# Patient Record
Sex: Female | Born: 1985 | Race: Black or African American | Hispanic: No | Marital: Married | Smoking: Former smoker
Health system: Southern US, Community
[De-identification: ages and names within clinical notes are randomized; demographics above are authoritative.]

## PROBLEM LIST (undated history)

## (undated) DIAGNOSIS — K219 Gastro-esophageal reflux disease without esophagitis: Secondary | ICD-10-CM

## (undated) DIAGNOSIS — I10 Essential (primary) hypertension: Secondary | ICD-10-CM

## (undated) DIAGNOSIS — N390 Urinary tract infection, site not specified: Secondary | ICD-10-CM

## (undated) HISTORY — PX: FRACTURE SURGERY: SHX138

---

## 2012-01-07 ENCOUNTER — Telehealth: Payer: Self-pay | Admitting: Emergency Medicine

## 2014-07-06 ENCOUNTER — Emergency Department (HOSPITAL_COMMUNITY)
Admission: EM | Admit: 2014-07-06 | Discharge: 2014-07-06 | Disposition: A | Discharge status: Home / Self Care | Payer: Self-pay | Attending: Emergency Medicine | Admitting: Emergency Medicine

## 2014-07-06 ENCOUNTER — Encounter (HOSPITAL_COMMUNITY): Payer: Self-pay

## 2014-07-06 ENCOUNTER — Emergency Department (HOSPITAL_COMMUNITY): Payer: Self-pay

## 2014-07-06 DIAGNOSIS — Z720 Tobacco use: Secondary | ICD-10-CM | POA: Insufficient documentation

## 2014-07-06 DIAGNOSIS — Y9339 Activity, other involving climbing, rappelling and jumping off: Secondary | ICD-10-CM | POA: Insufficient documentation

## 2014-07-06 DIAGNOSIS — T148XXA Other injury of unspecified body region, initial encounter: Secondary | ICD-10-CM

## 2014-07-06 DIAGNOSIS — Y92410 Unspecified street and highway as the place of occurrence of the external cause: Secondary | ICD-10-CM | POA: Insufficient documentation

## 2014-07-06 DIAGNOSIS — I10 Essential (primary) hypertension: Secondary | ICD-10-CM | POA: Insufficient documentation

## 2014-07-06 DIAGNOSIS — S82851A Displaced trimalleolar fracture of right lower leg, initial encounter for closed fracture: Secondary | ICD-10-CM | POA: Insufficient documentation

## 2014-07-06 DIAGNOSIS — Y998 Other external cause status: Secondary | ICD-10-CM | POA: Insufficient documentation

## 2014-07-06 DIAGNOSIS — W19XXXA Unspecified fall, initial encounter: Secondary | ICD-10-CM

## 2014-07-06 HISTORY — DX: Essential (primary) hypertension: I10

## 2014-07-06 LAB — CBC WITH DIFFERENTIAL/PLATELET
BASOS ABS: 0 10*3/uL (ref 0.0–0.1)
Basophils Relative: 0 % (ref 0–1)
EOS PCT: 1 % (ref 0–5)
Eosinophils Absolute: 0 10*3/uL (ref 0.0–0.7)
HCT: 40.6 % (ref 36.0–46.0)
Hemoglobin: 13.8 g/dL (ref 12.0–15.0)
LYMPHS PCT: 25 % (ref 12–46)
Lymphs Abs: 1.6 10*3/uL (ref 0.7–4.0)
MCH: 28.6 pg (ref 26.0–34.0)
MCHC: 34 g/dL (ref 30.0–36.0)
MCV: 84.1 fL (ref 78.0–100.0)
MONO ABS: 0.3 10*3/uL (ref 0.1–1.0)
Monocytes Relative: 4 % (ref 3–12)
Neutro Abs: 4.4 10*3/uL (ref 1.7–7.7)
Neutrophils Relative %: 70 % (ref 43–77)
Platelets: 274 10*3/uL (ref 150–400)
RBC: 4.83 MIL/uL (ref 3.87–5.11)
RDW: 13.5 % (ref 11.5–15.5)
WBC: 6.3 10*3/uL (ref 4.0–10.5)

## 2014-07-06 LAB — BASIC METABOLIC PANEL
ANION GAP: 9 (ref 5–15)
CO2: 25 mmol/L (ref 22–32)
CREATININE: 0.92 mg/dL (ref 0.44–1.00)
Calcium: 9.1 mg/dL (ref 8.9–10.3)
Chloride: 104 mmol/L (ref 101–111)
GFR calc Af Amer: >60 mL/min (ref >60)
Glucose, Bld: 123 mg/dL — ABNORMAL HIGH (ref 65–99)
Potassium: 3.9 mmol/L (ref 3.5–5.1)
Sodium: 138 mmol/L (ref 135–145)

## 2014-07-06 LAB — POC URINE PREG, ED: Preg Test, Ur: NEGATIVE

## 2014-07-06 MED ORDER — OXYCODONE-ACETAMINOPHEN 5-325 MG PO TABS: 1.0000 | ORAL_TABLET | ORAL | Status: DC | PRN

## 2014-07-06 MED ORDER — TETANUS-DIPHTH-ACELL PERTUSSIS 5-2.5-18.5 LF-MCG/0.5 IM SUSP
0.5000 mL | Freq: Once | INTRAMUSCULAR | Status: AC
  Administered 2014-07-06: 0.5 mL via INTRAMUSCULAR
  Filled 2014-07-06: qty 0.5

## 2014-07-06 MED ORDER — DIAZEPAM 5 MG/ML IJ SOLN
5.0000 mg | Freq: Once | INTRAMUSCULAR | Status: AC
Start: 2014-07-06 — End: 2014-07-06
  Administered 2014-07-06: 5 mg via INTRAVENOUS
  Filled 2014-07-06: qty 2

## 2014-07-06 MED ORDER — HYDROMORPHONE HCL 1 MG/ML IJ SOLN
1.0000 mg | Freq: Once | INTRAMUSCULAR | Status: AC
  Administered 2014-07-06: 1 mg via INTRAVENOUS
  Filled 2014-07-06: qty 1

## 2014-07-06 NOTE — ED Notes (Signed)
MD at bedside to reduce ankle.  

## 2014-07-06 NOTE — ED Notes (Signed)
Wasted 5MG  diazepam with Lesli Albee RN 0700 07/06/14.
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
## 2014-07-06 NOTE — Progress Notes (Signed)
Orthopedic Tech Progress Note Patient Details:  Rebecca Rowe 11-05-1985 [id number]  Ortho Devices Type of Ortho Device: Ace wrap, Post (short leg) splint, Stirrup splint, Crutches Ortho Device/Splint Interventions: Application   [name], Mickie Bail 07/06/2014, 2:41 AM
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
## 2014-07-06 NOTE — Discharge Instructions (Signed)
Ankle Fracture °A fracture is a break in a bone. The ankle joint is made up of three bones. These include the lower (distal) sections of your lower leg bones, called the tibia and fibula, along with a bone in your foot, called the talus. Depending on how bad the break is and if more than one ankle joint bone is broken, a cast or splint is used to protect and keep your injured bone from moving while it heals. Sometimes, surgery is required to help the fracture heal properly.  °There are two general types of fractures: °· Stable fracture. This includes a single fracture line through one bone, with no injury to ankle ligaments. A fracture of the talus that does not have any displacement (movement of the bone on either side of the fracture line) is also stable. °· Unstable fracture. This includes more than one fracture line through one or more bones in the ankle joint. It also includes fractures that have displacement of the bone on either side of the fracture line. °CAUSES °· A direct blow to the ankle.   °· Quickly and severely twisting your ankle. °· Trauma, such as a car accident or falling from a significant height. °RISK FACTORS °You may be at a higher risk of ankle fracture if: °· You have certain medical conditions. °· You are involved in high-impact sports. °· You are involved in a high-impact car accident. °SIGNS AND SYMPTOMS  °· Tender and swollen ankle. °· Bruising around the injured ankle. °· Pain on movement of the ankle. °· Difficulty walking or putting weight on the ankle. °· A cold foot below the site of the ankle injury. This can occur if the blood vessels passing through your injured ankle were also damaged. °· Numbness in the foot below the site of the ankle injury. °DIAGNOSIS  °An ankle fracture is usually diagnosed with a physical exam and X-rays. A CT scan may also be required for complex fractures. °TREATMENT  °Stable fractures are treated with a cast or splint and using crutches to avoid putting  weight on your injured ankle. This is followed by an ankle strengthening program. Some patients require a special type of cast, depending on other medical problems they may have. Unstable fractures require surgery to ensure the bones heal properly. Your health care provider will tell you what type of fracture you have and the best treatment for your condition. °HOME CARE INSTRUCTIONS  °· Review correct crutch use with your health care provider and use your crutches as directed. Safe use of crutches is extremely important. Misuse of crutches can cause you to fall or cause injury to nerves in your hands or armpits. °· Do not put weight or pressure on the injured ankle until directed by your health care provider. °· To lessen the swelling, keep the injured leg elevated while sitting or lying down. °· Apply ice to the injured area: °¨ Put ice in a plastic bag. °¨ Place a towel between your cast and the bag. °¨ Leave the ice on for 20 minutes, 2-3 times a day. °· If you have a plaster or fiberglass cast: °¨ Do not try to scratch the skin under the cast with any objects. This can increase your risk of skin infection. °¨ Check the skin around the cast every day. You may put lotion on any red or sore areas. °¨ Keep your cast dry and clean. °· If you have a plaster splint: °¨ Wear the splint as directed. °¨ You may loosen the elastic   around the splint if your toes become numb, tingle, or turn cold or blue.  Do not put pressure on any part of your cast or splint; it may break. Rest your cast only on a pillow the first 24 hours until it is fully hardened.  Your cast or splint can be protected during bathing with a plastic bag sealed to your skin with medical tape. Do not lower the cast or splint into water.  Take medicines as directed by your health care provider. Only take over-the-counter or prescription medicines for pain, discomfort, or fever as directed by your health care provider.  Do not drive a vehicle until  your health care provider specifically tells you it is safe to do so.  If your health care provider has given you a follow-up appointment, it is very important to keep that appointment. Not keeping the appointment could result in a chronic or permanent injury, pain, and disability. If you have any problem keeping the appointment, call the facility for assistance. SEEK MEDICAL CARE IF: You develop increased swelling or discomfort. SEEK IMMEDIATE MEDICAL CARE IF:   Your cast gets damaged or breaks.  You have continued severe pain.  You develop new pain or swelling after the cast was put on.  Your skin or toenails below the injury turn blue or gray.  Your skin or toenails below the injury feel cold, numb, or have loss of sensitivity to touch.  There is a bad smell or pus draining from under the cast. MAKE SURE YOU:   Understand these instructions.  Will watch your condition.  Will get help right away if you are not doing well or get worse. Document Released: 12/31/1999 Document Revised: 01/07/2013 Document Reviewed: 08/01/2012 [facility] Patient Information 2015 [place], [place]. This information is not intended to replace advice given to you by your health care provider. Make sure you discuss any questions you have with your health care provider.  Abrasion An abrasion is a cut or scrape of the skin. Abrasions do not extend through all layers of the skin and most heal within 10 days. It is important to care for your abrasion properly to prevent infection. CAUSES  Most abrasions are caused by falling on, or gliding across, the ground or other surface. When your skin rubs on something, the outer and inner layer of skin rubs off, causing an abrasion. DIAGNOSIS  Your caregiver will be able to diagnose an abrasion during a physical exam.  TREATMENT  Your treatment depends on how large and deep the abrasion is. Generally, your abrasion will be cleaned with water and a mild soap to remove any  dirt or debris. An antibiotic ointment may be put over the abrasion to prevent an infection. A bandage (dressing) may be wrapped around the abrasion to keep it from getting dirty.  You may need a tetanus shot if:  You cannot remember when you had your last tetanus shot.  You have never had a tetanus shot.  The injury broke your skin. If you get a tetanus shot, your arm may swell, get red, and feel warm to the touch. This is common and not a problem. If you need a tetanus shot and you choose not to have one, there is a rare chance of getting tetanus. Sickness from tetanus can be serious.  HOME CARE INSTRUCTIONS   If a dressing was applied, change it at least once a day or as directed by your caregiver. If the bandage sticks, soak it off with warm water.  Wash the area with water and a mild soap to remove all the ointment 2 times a day. Rinse off the soap and pat the area dry with a clean towel.   Reapply any ointment as directed by your caregiver. This will help prevent infection and keep the bandage from sticking. Use gauze over the wound and under the dressing to help keep the bandage from sticking.   Change your dressing right away if it becomes wet or dirty.   Only take over-the-counter or prescription medicines for pain, discomfort, or fever as directed by your caregiver.   Follow up with your caregiver within 24-48 hours for a wound check, or as directed. If you were not given a wound-check appointment, look closely at your abrasion for redness, swelling, or pus. These are signs of infection. SEEK IMMEDIATE MEDICAL CARE IF:   You have increasing pain in the wound.   You have redness, swelling, or tenderness around the wound.   You have pus coming from the wound.   You have a fever or persistent symptoms for more than 2-3 days.  You have a fever and your symptoms suddenly get worse.  You have a bad smell coming from the wound or dressing.  MAKE SURE YOU:    Understand these instructions.  Will watch your condition.  Will get help right away if you are not doing well or get worse. Document Released: 10/12/2004 Document Revised: 12/20/2011 Document Reviewed: 12/06/2010 [facility] Patient Information 2015 [place], [place]. This information is not intended to replace advice given to you by your health care provider. Make sure you discuss any questions you have with your health care provider.

## 2014-07-09 ENCOUNTER — Other Ambulatory Visit (HOSPITAL_COMMUNITY): Payer: Self-pay | Admitting: Orthopaedic Surgery

## 2014-07-09 DIAGNOSIS — M25571 Pain in right ankle and joints of right foot: Secondary | ICD-10-CM

## 2014-07-10 ENCOUNTER — Other Ambulatory Visit (HOSPITAL_COMMUNITY): Payer: Self-pay | Admitting: Orthopaedic Surgery

## 2014-07-14 ENCOUNTER — Inpatient Hospital Stay (HOSPITAL_COMMUNITY)
Admission: RE | Admit: 2014-07-14 | Discharge: 2014-07-14 | Disposition: A | Discharge status: Home / Self Care | Payer: Self-pay | Source: Ambulatory Visit

## 2014-07-14 ENCOUNTER — Ambulatory Visit (HOSPITAL_COMMUNITY): Admission: RE | Admit: 2014-07-14 | Payer: No Typology Code available for payment source | Source: Ambulatory Visit

## 2014-07-14 NOTE — Progress Notes (Signed)
Patient did not come to PAT appointment, no answer to repeated phone calls.  I left  A message on voice mail.  I instructed the patient to arrive at [facility] entrance at 0630  , nothing to eat or drink after midnight.   I instructed the patient to take the following medications in the am with just enough water to get them down: Percocet if able to take on an empty stomach.  I instructed patient to not take any more Naproxen.  I asked patient to not wear any lotions, powders, cologne, jewelry, piercing, make-up or nail polish and to not shave.  I asked the patient to call [phone], in the am if there were any questions or problems.

## 2014-07-14 NOTE — Pre-Procedure Instructions (Signed)
Rebecca Rowe  07/14/2014      [facility] [address], [place] - [address] [place] [id number] Phone: [phone] Fax: [phone]    Your procedure is scheduled on   Wednesday  07/15/14  [facility] Admitting at 630 A.M.  Call this number if you have problems the morning of surgery:  [phone]   Remember:  Do not eat food or drink liquids after midnight.  Take these medicines the morning of surgery with A SIP OF WATER    OXYCODONE  IF NEEDED   (STOP NAPROXEN/ ALEVE/ ANAPROX, NO ASPIRIN, GOODY POWDERS, vitamins, herbal medicines)   Do not wear jewelry, make-up or nail polish.  Do not wear lotions, powders, or perfumes.  You may wear deodorant.  Do not shave 48 hours prior to surgery.  Men may shave face and neck.  Do not bring valuables to the hospital.  [facility] is not responsible for any belongings or valuables.  Contacts, dentures or bridgework may not be worn into surgery.  Leave your suitcase in the car.  After surgery it may be brought to your room.  For patients admitted to the hospital, discharge time will be determined by your treatment team.  Patients discharged the day of surgery will not be allowed to drive home.   Name and phone number of your driver:    Special instructions:  [facility] - Preparing for Surgery  Before surgery, you can play an important role.  Because skin is not sterile, your skin needs to be as free of germs as possible.  You can reduce the number of germs on you skin by washing with CHG (chlorahexidine gluconate) soap before surgery.  CHG is an antiseptic cleaner which kills germs and bonds with the skin to continue killing germs even after washing.  Please DO NOT use if you have an allergy to CHG or antibacterial soaps.  If your skin becomes reddened/irritated stop using the CHG and inform your nurse when you arrive at Short Stay.  Do not shave (including  legs and underarms) for at least 48 hours prior to the first CHG shower.  You may shave your face.  Please follow these instructions carefully:   1.  Shower with CHG Soap the night before surgery and the                                morning of Surgery.  2.  If you choose to wash your hair, wash your hair first as usual with your       normal shampoo.  3.  After you shampoo, rinse your hair and body thoroughly to remove the                      Shampoo.  4.  Use CHG as you would any other liquid soap.  You can apply chg directly       to the skin and wash gently with scrungie or a clean washcloth.  5.  Apply the CHG Soap to your body ONLY FROM THE NECK DOWN.        Do not use on open wounds or open sores.  Avoid contact with your eyes,       ears, mouth and genitals (private parts).  Wash genitals (private parts)       with your normal soap.  6.  Wash thoroughly, paying special attention to the area where your surgery        will be performed.  7.  Thoroughly rinse your body with warm water from the neck down.  8.  DO NOT shower/wash with your normal soap after using and rinsing off       the CHG Soap.  9.  Pat yourself dry with a clean towel.            10.  Wear clean pajamas.            11.  Place clean sheets on your bed the night of your first shower and do not        sleep with pets.  Day of Surgery  Do not apply any lotions/deoderants the morning of surgery.  Please wear clean clothes to the hospital/surgery center.    Please read over the following fact sheets that you were given. Pain Booklet, Coughing and Deep Breathing, MRSA Information and Surgical Site Infection Prevention

## 2014-07-15 ENCOUNTER — Ambulatory Visit (HOSPITAL_COMMUNITY): Payer: Self-pay | Admitting: Certified Registered Nurse Anesthetist

## 2014-07-15 ENCOUNTER — Observation Stay (HOSPITAL_COMMUNITY)
Admission: RE | Admit: 2014-07-15 | Discharge: 2014-07-16 | Disposition: A | Discharge status: Home / Self Care | Payer: Self-pay | Source: Ambulatory Visit | Attending: Orthopaedic Surgery | Admitting: Orthopaedic Surgery

## 2014-07-15 ENCOUNTER — Ambulatory Visit (HOSPITAL_COMMUNITY): Payer: Self-pay

## 2014-07-15 ENCOUNTER — Encounter (HOSPITAL_COMMUNITY)
Admission: RE | Disposition: A | Discharge status: Home / Self Care | Payer: Self-pay | Source: Ambulatory Visit | Attending: Orthopaedic Surgery

## 2014-07-15 ENCOUNTER — Encounter (HOSPITAL_COMMUNITY): Payer: Self-pay | Admitting: Certified Registered Nurse Anesthetist

## 2014-07-15 DIAGNOSIS — Z9889 Other specified postprocedural states: Secondary | ICD-10-CM

## 2014-07-15 DIAGNOSIS — F17210 Nicotine dependence, cigarettes, uncomplicated: Secondary | ICD-10-CM | POA: Insufficient documentation

## 2014-07-15 DIAGNOSIS — Z419 Encounter for procedure for purposes other than remedying health state, unspecified: Secondary | ICD-10-CM

## 2014-07-15 DIAGNOSIS — X58XXXA Exposure to other specified factors, initial encounter: Secondary | ICD-10-CM | POA: Insufficient documentation

## 2014-07-15 DIAGNOSIS — Z8781 Personal history of (healed) traumatic fracture: Secondary | ICD-10-CM

## 2014-07-15 DIAGNOSIS — S82844A Nondisplaced bimalleolar fracture of right lower leg, initial encounter for closed fracture: Principal | ICD-10-CM | POA: Insufficient documentation

## 2014-07-15 DIAGNOSIS — Y929 Unspecified place or not applicable: Secondary | ICD-10-CM | POA: Insufficient documentation

## 2014-07-15 DIAGNOSIS — Z6833 Body mass index (BMI) 33.0-33.9, adult: Secondary | ICD-10-CM | POA: Insufficient documentation

## 2014-07-15 DIAGNOSIS — K219 Gastro-esophageal reflux disease without esophagitis: Secondary | ICD-10-CM | POA: Insufficient documentation

## 2014-07-15 DIAGNOSIS — I10 Essential (primary) hypertension: Secondary | ICD-10-CM | POA: Insufficient documentation

## 2014-07-15 HISTORY — PX: ORIF ANKLE FRACTURE: SHX5408

## 2014-07-15 HISTORY — DX: Urinary tract infection, site not specified: N39.0

## 2014-07-15 HISTORY — PX: ORIF ANKLE FRACTURE BIMALLEOLAR: SUR920

## 2014-07-15 HISTORY — DX: Gastro-esophageal reflux disease without esophagitis: K21.9

## 2014-07-15 LAB — SURGICAL PCR SCREEN
MRSA, PCR: NEGATIVE
Staphylococcus aureus: NEGATIVE

## 2014-07-15 SURGERY — OPEN REDUCTION INTERNAL FIXATION (ORIF) ANKLE FRACTURE
Anesthesia: Regional | Site: Ankle | Laterality: Right

## 2014-07-15 MED ORDER — ONDANSETRON HCL 4 MG/2ML IJ SOLN
INTRAMUSCULAR | Status: DC | PRN
  Administered 2014-07-15: 4 mg via INTRAVENOUS

## 2014-07-15 MED ORDER — OXYCODONE HCL 5 MG PO TABS
ORAL_TABLET | ORAL | Status: AC
  Filled 2014-07-15: qty 2

## 2014-07-15 MED ORDER — PROPOFOL 10 MG/ML IV BOLUS
INTRAVENOUS | Status: AC
  Filled 2014-07-15: qty 20

## 2014-07-15 MED ORDER — METOCLOPRAMIDE HCL 5 MG PO TABS: 5.0000 mg | ORAL_TABLET | Freq: Three times a day (TID) | ORAL | Status: DC | PRN

## 2014-07-15 MED ORDER — MIDAZOLAM HCL 5 MG/5ML IJ SOLN
INTRAMUSCULAR | Status: DC | PRN
  Administered 2014-07-15: 2 mg via INTRAVENOUS

## 2014-07-15 MED ORDER — BUPIVACAINE HCL (PF) 0.25 % IJ SOLN
INTRAMUSCULAR | Status: AC
  Filled 2014-07-15: qty 30

## 2014-07-15 MED ORDER — ACETAMINOPHEN 325 MG PO TABS: 650.0000 mg | ORAL_TABLET | Freq: Four times a day (QID) | ORAL | Status: DC | PRN

## 2014-07-15 MED ORDER — CEFAZOLIN SODIUM-DEXTROSE 2-3 GM-% IV SOLR
2.0000 g | INTRAVENOUS | Status: AC
  Administered 2014-07-15: 2 g via INTRAVENOUS
  Filled 2014-07-15: qty 50

## 2014-07-15 MED ORDER — MORPHINE SULFATE 2 MG/ML IJ SOLN: 1.0000 mg | INTRAMUSCULAR | Status: DC | PRN

## 2014-07-15 MED ORDER — SORBITOL 70 % SOLN
30.0000 mL | Freq: Every day | Status: DC | PRN
  Filled 2014-07-15: qty 30

## 2014-07-15 MED ORDER — FENTANYL CITRATE (PF) 100 MCG/2ML IJ SOLN
25.0000 ug | INTRAMUSCULAR | Status: DC | PRN
  Administered 2014-07-15: 50 ug via INTRAVENOUS

## 2014-07-15 MED ORDER — LIDOCAINE HCL (CARDIAC) 20 MG/ML IV SOLN
INTRAVENOUS | Status: DC | PRN
Start: 2014-07-15 — End: 2014-07-15
  Administered 2014-07-15: 80 mg via INTRAVENOUS

## 2014-07-15 MED ORDER — PNEUMOCOCCAL VAC POLYVALENT 25 MCG/0.5ML IJ INJ
0.5000 mL | INJECTION | INTRAMUSCULAR | Status: AC
  Administered 2014-07-16: 0.5 mL via INTRAMUSCULAR

## 2014-07-15 MED ORDER — DIPHENHYDRAMINE HCL 12.5 MG/5ML PO ELIX: 25.0000 mg | ORAL_SOLUTION | ORAL | Status: DC | PRN

## 2014-07-15 MED ORDER — OXYCODONE HCL 5 MG PO TABS
5.0000 mg | ORAL_TABLET | ORAL | Status: DC | PRN
Start: 2014-07-15 — End: 2014-07-16
  Administered 2014-07-15 – 2014-07-16 (×2): 10 mg via ORAL
  Filled 2014-07-15: qty 2

## 2014-07-15 MED ORDER — ACETAMINOPHEN 650 MG RE SUPP
650.0000 mg | Freq: Four times a day (QID) | RECTAL | Status: DC | PRN
Start: 2014-07-15 — End: 2014-07-16

## 2014-07-15 MED ORDER — ONDANSETRON HCL 4 MG/2ML IJ SOLN: 4.0000 mg | Freq: Four times a day (QID) | INTRAMUSCULAR | Status: DC | PRN

## 2014-07-15 MED ORDER — LACTATED RINGERS IV SOLN
INTRAVENOUS | Status: DC | PRN
  Administered 2014-07-15 (×2): via INTRAVENOUS

## 2014-07-15 MED ORDER — OXYCODONE HCL 5 MG PO TABS: 5.0000 mg | ORAL_TABLET | ORAL | Status: DC | PRN

## 2014-07-15 MED ORDER — BUPIVACAINE-EPINEPHRINE (PF) 0.5% -1:200000 IJ SOLN
INTRAMUSCULAR | Status: DC | PRN
  Administered 2014-07-15: 30 mL via PERINEURAL
  Administered 2014-07-15: 15 mL via PERINEURAL

## 2014-07-15 MED ORDER — FENTANYL CITRATE (PF) 250 MCG/5ML IJ SOLN
INTRAMUSCULAR | Status: AC
  Filled 2014-07-15: qty 5

## 2014-07-15 MED ORDER — OXYCODONE HCL 5 MG PO TABS: 5.0000 mg | ORAL_TABLET | Freq: Once | ORAL | Status: DC | PRN

## 2014-07-15 MED ORDER — SODIUM CHLORIDE 0.9 % IV SOLN
INTRAVENOUS | Status: DC
  Administered 2014-07-15: 14:00:00 via INTRAVENOUS

## 2014-07-15 MED ORDER — ASPIRIN EC 325 MG PO TBEC: 325.0000 mg | DELAYED_RELEASE_TABLET | Freq: Two times a day (BID) | ORAL | Status: DC

## 2014-07-15 MED ORDER — 0.9 % SODIUM CHLORIDE (POUR BTL) OPTIME
TOPICAL | Status: DC | PRN
  Administered 2014-07-15: 1000 mL

## 2014-07-15 MED ORDER — POLYETHYLENE GLYCOL 3350 17 G PO PACK: 17.0000 g | PACK | Freq: Every day | ORAL | Status: DC | PRN

## 2014-07-15 MED ORDER — CEFAZOLIN SODIUM-DEXTROSE 2-3 GM-% IV SOLR
2.0000 g | Freq: Four times a day (QID) | INTRAVENOUS | Status: AC
  Administered 2014-07-15 – 2014-07-16 (×3): 2 g via INTRAVENOUS
  Filled 2014-07-15 (×3): qty 50

## 2014-07-15 MED ORDER — ASPIRIN EC 325 MG PO TBEC
325.0000 mg | DELAYED_RELEASE_TABLET | Freq: Two times a day (BID) | ORAL | Status: DC
  Administered 2014-07-15 – 2014-07-16 (×2): 325 mg via ORAL
  Filled 2014-07-15 (×3): qty 1

## 2014-07-15 MED ORDER — KETOROLAC TROMETHAMINE 15 MG/ML IJ SOLN
15.0000 mg | Freq: Four times a day (QID) | INTRAMUSCULAR | Status: AC
  Administered 2014-07-15 – 2014-07-16 (×4): 15 mg via INTRAVENOUS
  Filled 2014-07-15 (×3): qty 1

## 2014-07-15 MED ORDER — DEXAMETHASONE SODIUM PHOSPHATE 4 MG/ML IJ SOLN
INTRAMUSCULAR | Status: DC | PRN
  Administered 2014-07-15: 8 mg via INTRAVENOUS

## 2014-07-15 MED ORDER — FENTANYL CITRATE (PF) 100 MCG/2ML IJ SOLN
INTRAMUSCULAR | Status: AC
  Filled 2014-07-15: qty 2

## 2014-07-15 MED ORDER — METOCLOPRAMIDE HCL 5 MG/ML IJ SOLN: 5.0000 mg | Freq: Three times a day (TID) | INTRAMUSCULAR | Status: DC | PRN

## 2014-07-15 MED ORDER — DEXTROSE 5 % IV SOLN
500.0000 mg | INTRAVENOUS | Status: AC
  Administered 2014-07-15: 500 mg via INTRAVENOUS
  Filled 2014-07-15: qty 5

## 2014-07-15 MED ORDER — FENTANYL CITRATE (PF) 100 MCG/2ML IJ SOLN
INTRAMUSCULAR | Status: DC | PRN
  Administered 2014-07-15 (×4): 50 ug via INTRAVENOUS
  Administered 2014-07-15 (×2): 25 ug via INTRAVENOUS
  Administered 2014-07-15: 50 ug via INTRAVENOUS

## 2014-07-15 MED ORDER — MIDAZOLAM HCL 2 MG/2ML IJ SOLN
INTRAMUSCULAR | Status: AC
  Filled 2014-07-15: qty 2

## 2014-07-15 MED ORDER — METHOCARBAMOL 500 MG PO TABS: 500.0000 mg | ORAL_TABLET | Freq: Four times a day (QID) | ORAL | Status: DC | PRN

## 2014-07-15 MED ORDER — ONDANSETRON HCL 4 MG PO TABS: 4.0000 mg | ORAL_TABLET | Freq: Four times a day (QID) | ORAL | Status: DC | PRN

## 2014-07-15 MED ORDER — PROPOFOL 10 MG/ML IV BOLUS
INTRAVENOUS | Status: DC | PRN
  Administered 2014-07-15: 20 mg via INTRAVENOUS

## 2014-07-15 MED ORDER — METHOCARBAMOL 1000 MG/10ML IJ SOLN
500.0000 mg | Freq: Four times a day (QID) | INTRAVENOUS | Status: DC | PRN
  Filled 2014-07-15: qty 5

## 2014-07-15 MED ORDER — OXYCODONE HCL 5 MG/5ML PO SOLN: 5.0000 mg | Freq: Once | ORAL | Status: DC | PRN

## 2014-07-15 MED ORDER — MUPIROCIN 2 % EX OINT
1.0000 "application " | TOPICAL_OINTMENT | Freq: Once | CUTANEOUS | Status: AC
  Administered 2014-07-15: 1 via TOPICAL
  Filled 2014-07-15: qty 22

## 2014-07-15 SURGICAL SUPPLY — 68 items
BANDAGE ELASTIC 4 VELCRO ST LF (GAUZE/BANDAGES/DRESSINGS) IMPLANT
BANDAGE ELASTIC 6 VELCRO ST LF (GAUZE/BANDAGES/DRESSINGS) ×3 IMPLANT
BANDAGE ESMARK 6X9 LF (GAUZE/BANDAGES/DRESSINGS) ×1 IMPLANT
BIT DRILL CANN 2.7 (BIT) ×1
BIT DRILL CANN 2.7MM (BIT) ×1
BIT DRILL SRG 2.7XCANN AO CPLG (BIT) ×1 IMPLANT
BIT DRL SRG 2.7XCANN AO CPLNG (BIT) ×1
BLADE SURG 15 STRL LF DISP TIS (BLADE) ×1 IMPLANT
BLADE SURG 15 STRL SS (BLADE) ×2
BNDG COHESIVE 4X5 TAN STRL (GAUZE/BANDAGES/DRESSINGS) ×3 IMPLANT
BNDG COHESIVE 6X5 TAN STRL LF (GAUZE/BANDAGES/DRESSINGS) ×3 IMPLANT
BNDG ESMARK 6X9 LF (GAUZE/BANDAGES/DRESSINGS) ×3
CANISTER SUCT 3000ML PPV (MISCELLANEOUS) ×3 IMPLANT
COVER SURGICAL LIGHT HANDLE (MISCELLANEOUS) ×3 IMPLANT
CUFF TOURNIQUET SINGLE 34IN LL (TOURNIQUET CUFF) ×3 IMPLANT
CUFF TOURNIQUET SINGLE 44IN (TOURNIQUET CUFF) IMPLANT
DRAPE C-ARM 42X72 X-RAY (DRAPES) ×6 IMPLANT
DRAPE C-ARMOR (DRAPES) ×3 IMPLANT
DRAPE IMP U-DRAPE 54X76 (DRAPES) ×3 IMPLANT
DRAPE INCISE IOBAN 66X45 STRL (DRAPES) ×3 IMPLANT
DRAPE U-SHAPE 47X51 STRL (DRAPES) ×3 IMPLANT
DRILL 2.6X122MM WL AO SHAFT (BIT) ×3 IMPLANT
DRSG PAD ABDOMINAL 8X10 ST (GAUZE/BANDAGES/DRESSINGS) ×3 IMPLANT
DURAPREP 26ML APPLICATOR (WOUND CARE) ×6 IMPLANT
ELECT CAUTERY BLADE 6.4 (BLADE) ×3 IMPLANT
ELECT REM PT RETURN 9FT ADLT (ELECTROSURGICAL) ×3
ELECTRODE REM PT RTRN 9FT ADLT (ELECTROSURGICAL) ×1 IMPLANT
FACESHIELD WRAPAROUND (MASK) ×3 IMPLANT
GAUZE SPONGE 4X4 12PLY STRL (GAUZE/BANDAGES/DRESSINGS) IMPLANT
GAUZE XEROFORM 5X9 LF (GAUZE/BANDAGES/DRESSINGS) ×3 IMPLANT
GLOVE NEODERM STRL 7.5 LF PF (GLOVE) ×1 IMPLANT
GLOVE SURG NEODERM 7.5  LF PF (GLOVE) ×2
GLOVE SURG SYN 7.5  E (GLOVE) ×4
GLOVE SURG SYN 7.5 E (GLOVE) ×2 IMPLANT
GOWN STRL REIN XL XLG (GOWN DISPOSABLE) ×3 IMPLANT
K-WIRE ORTHOPEDIC 1.4X150L (WIRE) ×6
KIT BASIN OR (CUSTOM PROCEDURE TRAY) ×3 IMPLANT
KIT ROOM TURNOVER OR (KITS) ×3 IMPLANT
KWIRE ORTHOPEDIC 1.4X150L (WIRE) ×2 IMPLANT
NEEDLE HYPO 25GX1X1/2 BEV (NEEDLE) IMPLANT
NS IRRIG 1000ML POUR BTL (IV SOLUTION) ×3 IMPLANT
PACK ORTHO EXTREMITY (CUSTOM PROCEDURE TRAY) ×3 IMPLANT
PAD ARMBOARD 7.5X6 YLW CONV (MISCELLANEOUS) ×6 IMPLANT
PAD CAST 3X4 CTTN HI CHSV (CAST SUPPLIES) IMPLANT
PAD CAST 4YDX4 CTTN HI CHSV (CAST SUPPLIES) ×1 IMPLANT
PADDING CAST COTTON 3X4 STRL (CAST SUPPLIES)
PADDING CAST COTTON 4X4 STRL (CAST SUPPLIES) ×2
PADDING CAST COTTON 6X4 STRL (CAST SUPPLIES) ×3 IMPLANT
PLATE FIBULA 4H (Plate) ×3 IMPLANT
SCREW BONE 14MMX3.5MM (Screw) ×3 IMPLANT
SCREW BONE 18 (Screw) ×6 IMPLANT
SCREW BONE NON-LCKING 3.5X12MM (Screw) ×6 IMPLANT
SCREW CANNULATED 4.0X36MM PT (Screw) ×3 IMPLANT
SCREW LOCK 3.5X14 (Screw) ×3 IMPLANT
SCREW LOCKING 3.5X16MM (Screw) ×6 IMPLANT
SPLINT FIBERGLASS 4X30 (CAST SUPPLIES) ×6 IMPLANT
SPONGE GAUZE 4X4 12PLY STER LF (GAUZE/BANDAGES/DRESSINGS) ×3 IMPLANT
SPONGE LAP 18X18 X RAY DECT (DISPOSABLE) ×3 IMPLANT
SUCTION FRAZIER TIP 10 FR DISP (SUCTIONS) ×3 IMPLANT
SUT ETHILON 3 0 PS 1 (SUTURE) ×9 IMPLANT
SUT VIC AB 2-0 CT1 27 (SUTURE) ×4
SUT VIC AB 2-0 CT1 TAPERPNT 27 (SUTURE) ×2 IMPLANT
SYR CONTROL 10ML LL (SYRINGE) IMPLANT
TOWEL OR 17X24 6PK STRL BLUE (TOWEL DISPOSABLE) ×6 IMPLANT
TOWEL OR 17X26 10 PK STRL BLUE (TOWEL DISPOSABLE) ×6 IMPLANT
TUBE CONNECTING 12'X1/4 (SUCTIONS) ×1
TUBE CONNECTING 12X1/4 (SUCTIONS) ×2 IMPLANT
WATER STERILE IRR 1000ML POUR (IV SOLUTION) IMPLANT

## 2014-07-15 NOTE — Anesthesia Procedure Notes (Addendum)
Procedure Name: LMA Insertion Date/Time: 07/15/2014 8:45 AM Performed by: Marylyn IshiharaFURLOW, DIANE Pre-anesthesia Checklist: Patient identified, Timeout performed, Emergency Drugs available, Suction available and Patient being monitored Patient Re-evaluated:Patient Re-evaluated prior to inductionOxygen Delivery Method: Circle system utilized Preoxygenation: Pre-oxygenation with 100% oxygen Intubation Type: IV induction Ventilation: Mask ventilation without difficulty LMA: LMA inserted LMA Size: 4.0 Number of attempts: 1 Placement Confirmation: positive ETCO2 and breath sounds checked- equal and bilateral Tube secured with: Tape Dental Injury: Teeth and Oropharynx as per pre-operative assessment    Anesthesia Regional Block:  Adductor canal block  Pre-Anesthetic Checklist: ,, timeout performed, Correct Patient, Correct Site, Correct Laterality, Correct Procedure, Correct Position, site marked, Risks and benefits discussed,  Surgical consent,  Pre-op evaluation,  At surgeon's request and post-op pain management  Laterality: Lower and Right  Prep: chloraprep       Needles:  Injection technique: Single-shot  Needle Type: Echogenic Stimulator Needle          Additional Needles:  Procedures: ultrasound guided (picture in chart) Adductor canal block  Nerve Stimulator or Paresthesia:  Response: plantar, 0.5 mA,   Additional Responses:   Narrative:  Injection made incrementally with aspirations every 5 mL.  Performed by: Personally  Anesthesiologist: Kenza Munar, CHRIS  Additional Notes: H+P and labs reviewed, risks and benefits discussed with patient, procedure tolerated well without complications   Anesthesia Regional Block:  Popliteal block  Pre-Anesthetic Checklist: ,, timeout performed, Correct Patient, Correct Site, Correct Laterality, Correct Procedure, Correct Position, site marked, Risks and benefits discussed,  Surgical consent,  Pre-op evaluation,  At surgeon's request and  post-op pain management  Laterality: Lower and Right  Prep: chloraprep       Needles:  Injection technique: Single-shot  Needle Type: Echogenic Stimulator Needle          Additional Needles:  Procedures: ultrasound guided (picture in chart) and nerve stimulator Popliteal block  Nerve Stimulator or Paresthesia:  Response: plantar, 0.5 mA,   Additional Responses:   Narrative:  Injection made incrementally with aspirations every 5 mL.  Performed by: Personally  Anesthesiologist: Heela Heishman, CHRIS  Additional Notes: H+P and labs reviewed, risks and benefits discussed with patient, procedure tolerated well without complications
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
## 2014-07-15 NOTE — Discharge Instructions (Signed)
° ° °  1. Keep splint clean and dry °2. Elevate foot above level of the heart °3. Take aspirin to prevent blood clots °4. Take pain meds as needed °5. Strict non weight bearing to operative extremity ° °

## 2014-07-15 NOTE — Transfer of Care (Signed)
Immediate Anesthesia Transfer of Care Note  Patient: Rebecca Rowe  Procedure(s) Performed: Procedure(s): OPEN REDUCTION INTERNAL FIXATION RIGHT BImalleolar ANKLE FRACTURE (Right)  Patient Location: PACU  Anesthesia Type:GA combined with regional for post-op pain  Level of Consciousness: awake, alert , oriented, patient cooperative, lethargic and responds to stimulation  Airway & Oxygen Therapy: Patient Spontanous Breathing and Patient connected to nasal cannula oxygen  Post-op Assessment: Report given to RN, Post -op Vital signs reviewed and stable, Patient moving all extremities X 4 and Patient able to stick tongue midline  Post vital signs: stable  Last Vitals:  Filed Vitals:   07/15/14 0652  BP: 142/89  Pulse: 97  Temp: 36.7 C  Resp: 18    Complications: No apparent anesthesia complications

## 2014-07-15 NOTE — Op Note (Signed)
Date of Surgery: 07/15/2014  INDICATIONS: Rebecca Rowe is a 29 y.o.-year-old female who sustained a right ankle fracture dislocation; she was indicated for open reduction and internal fixation due to the displaced nature of the articular fracture and came to the operating room today for this procedure. The patient did consent to the procedure after discussion of the risks and benefits.  PREOPERATIVE DIAGNOSIS: right bimalleolar ankle fracture dislocation  POSTOPERATIVE DIAGNOSIS: Same.  PROCEDURE: Open treatment of right ankle fracture with internal fixation. Bimalleolar CPT [phone]  SURGEON: N. Glee Arvin, M.D.  ASSIST: Hart Carwin, RNFA.  ANESTHESIA:  general, regional  TOURNIQUET TIME: 1 hr  IV FLUIDS AND URINE: See anesthesia.  ESTIMATED BLOOD LOSS: minimal mL.  IMPLANTS: Stryker Variax 4 hole distal fibula plate  COMPLICATIONS: None.  DESCRIPTION OF PROCEDURE: The patient was brought to the operating room and placed supine on the operating table.  The patient had been signed prior to the procedure and this was documented. The patient had the anesthesia placed by the anesthesiologist.  A nonsterile tourniquet was placed on the upper thigh.  The prep verification and incision time-outs were performed to confirm that this was the correct patient, site, side and location. The patient had an SCD on the opposite lower extremity. The patient did receive antibiotics prior to the incision and was re-dosed during the procedure as needed at indicated intervals.  The patient had the lower extremity prepped and draped in the standard surgical fashion.  The extremity was exsanguinated using an esmarch bandage and the tourniquet was inflated to 300 mm Hg.  The bony landmarks were palpated and a lateral incision over the distal fibula was used. Full-thickness flaps were created. The superficial peroneal nerve was not encountered. The fracture and the distal fibula were Exposed. Entrapped  periosteum and organized hematoma was removed from the fracture site. We then obtained a reduction and this was held in place with a tenaculum clamp. We then placed a single anterior to posterior lag screw to gain compression across the fracture. The clamp was removed and the fracture remained reduced. Confirmed under fluoroscopy. We then placed a 4 hole distal fibular plate on the lateral aspect of the fibula. Fluoroscopy was used to confirm the adequate placement. We then placed 3 nonlocking screws proximal to the fracture each giving excellent purchase.  We then used a nonlocking screw to contour the plate to the distal fibula. We then placed 3 locking unicortical screws distally. After this we then turned our attention to the medial malleolus fracture. A longitudinal incision was used. The saphenous neurovascular structures were identified and protected and mobilized. We then identified the fracture site. We removed any entrapped periosteum and organized hematoma. We then obtained a reduction of the medial malleolus fracture with a dental pick. This was confirmed under fluoroscopy. Given the small size of the fracture we were only able to accommodate 1 cannulated screw. We advanced a K wire up the medial malleolus perpendicular to the fracture. We then placed a 36 mm 4.0 cannulated screw over this wire to gain compression across the fracture site. This screw also gave excellent purchase. Final x-rays were then taken. An supination external rotation stress test was performed and there was no widening of the medial clear space.  The wounds were then thoroughly irrigated and closed in layer fashion using 0 Vicryls, 2-0 Vicryls, 3-0 nylon.  Sterile dressings were applied. The foot was immobilized in a short-leg splint. The patient tolerated the procedure well was extubated and  transferred to the PACU in stable condition. All sponge counts were correct.  POSTOPERATIVE PLAN: Rebecca Rowe will remain  nonweightbearing on this leg for approximately 6 weeks; Rebecca Rowe will return for suture removal in 2 weeks.  He will be immobilized in a short leg splint and then transitioned to a CAM walker at his first follow up appointment.  Rebecca Rowe will receive DVT prophylaxis based on other medications, activity level, and risk ratio of bleeding to thrombosis.  Mayra ReelN. Michael Xu, MD [facility] [phone] 10:33 AM

## 2014-07-15 NOTE — Care Management (Signed)
Referral for Home health needs will await PT eval. Rebecca FlurryHeather Farran Amsden RN BSN [phone]

## 2014-07-15 NOTE — Progress Notes (Signed)
Report given to emily watkins rn as Occupational hygienistcarefiver

## 2014-07-15 NOTE — H&P (Signed)
    PREOPERATIVE H&P  Chief Complaint: right trimalleolar ankle fracture, dislocation  HPI: Rebecca Rowe is a 29 y.o. female who presents for surgical treatment of right trimalleolar ankle fracture, dislocation.  She denies any changes in medical history.  Past Medical History  Diagnosis Date  . Hypertension     on no medications  . GERD (gastroesophageal reflux disease)     otc   tums  . Frequent UTI    Past Surgical History  Procedure Laterality Date  . No past surgeries     History   Social History  . Marital Status: Single    Spouse Name: N/A  . Number of Children: N/A  . Years of Education: N/A   Social History Main Topics  . Smoking status: Current Every Day Smoker -- 0.50 packs/day for 11 years  . Smokeless tobacco: Not on file  . Alcohol Use: Yes     Comment: daily  . Drug Use: No  . Sexual Activity: Not on file   Other Topics Concern  . None   Social History Narrative   History reviewed. No pertinent family history. No Known Allergies Prior to Admission medications   Medication Sig Start Date End Date Taking? Authorizing Provider  naproxen sodium (ANAPROX) 220 MG tablet Take 440 mg by mouth 2 (two) times daily as needed (pain). ALEVE   Yes Historical Provider, MD  oxyCODONE-acetaminophen (PERCOCET/ROXICET) 5-325 MG per tablet Take 1-2 tablets by mouth every 4 (four) hours as needed for severe pain. 07/06/14  Yes Mirian MoMatthew Gentry, MD     Positive ROS: All other systems have been reviewed and were otherwise negative with the exception of those mentioned in the HPI and as above.  Physical Exam: General: Alert, no acute distress Cardiovascular: No pedal edema Respiratory: No cyanosis, no use of accessory musculature GI: abdomen soft Skin: No lesions in the area of chief complaint Neurologic: Sensation intact distally Psychiatric: Patient is competent for consent with normal mood and affect Lymphatic: no lymphedema  MUSCULOSKELETAL: exam  stable  Assessment: right trimalleolar ankle fracture, dislocation  Plan: Plan for Procedure(s): OPEN REDUCTION INTERNAL FIXATION (ORIF) RIGHT ANKLE FRACTURE  The risks benefits and alternatives were discussed with the patient including but not limited to the risks of nonoperative treatment, versus surgical intervention including infection, bleeding, nerve injury,  blood clots, cardiopulmonary complications, morbidity, mortality, among others, and they were willing to proceed.   Cheral AlmasXu, Naiping Michael, MD   07/15/2014 7:59 AM

## 2014-07-15 NOTE — Anesthesia Postprocedure Evaluation (Signed)
  Anesthesia Post-op Note  Patient: Rebecca LernerAdrianne Hemme  Procedure(s) Performed: Procedure(s): OPEN REDUCTION INTERNAL FIXATION RIGHT BImalleolar ANKLE FRACTURE (Right)  Patient Location: PACU  Anesthesia Type:General and Regional  Level of Consciousness: awake  Airway and Oxygen Therapy: Patient Spontanous Breathing  Post-op Pain: none  Post-op Assessment: Post-op Vital signs reviewed, Patient's Cardiovascular Status Stable, Respiratory Function Stable, Patent Airway, No signs of Nausea or vomiting and Pain level controlled     RLE Motor Response: No movement due to regional block RLE Sensation: Numbness      Post-op Vital Signs: Reviewed and stable  Last Vitals:  Filed Vitals:   07/15/14 1444  BP: 117/71  Pulse: 81  Temp: 36.5 C  Resp: 22    Complications: No apparent anesthesia complications

## 2014-07-15 NOTE — Anesthesia Preprocedure Evaluation (Signed)
Anesthesia Evaluation  Patient identified by MRN, date of birth, ID band Patient awake    Reviewed: Allergy & Precautions, NPO status , Patient's Chart, lab work & pertinent test results  History of Anesthesia Complications Negative for: history of anesthetic complications  Airway Mallampati: II  TM Distance: >3 FB Neck ROM: Full    Dental  (+) Teeth Intact, Chipped   Pulmonary neg shortness of breath, neg sleep apnea, neg COPDneg recent URI, Current Smoker,  breath sounds clear to auscultation        Cardiovascular Rhythm:Regular     Neuro/Psych negative neurological ROS  negative psych ROS   GI/Hepatic negative GI ROS, Neg liver ROS,   Endo/Other  Morbid obesity  Renal/GU negative Renal ROS     Musculoskeletal   Abdominal   Peds  Hematology negative hematology ROS (+)   Anesthesia Other Findings   Reproductive/Obstetrics                             Anesthesia Physical Anesthesia Plan  ASA: II  Anesthesia Plan: General and Regional   Post-op Pain Management:    Induction: Intravenous  Airway Management Planned: LMA  Additional Equipment: None  Intra-op Plan:   Post-operative Plan: Extubation in OR  Informed Consent: I have reviewed the patients History and Physical, chart, labs and discussed the procedure including the risks, benefits and alternatives for the proposed anesthesia with the patient or authorized representative who has indicated his/her understanding and acceptance.   Dental advisory given  Plan Discussed with: CRNA and Surgeon  Anesthesia Plan Comments:         Anesthesia Quick Evaluation

## 2014-07-16 ENCOUNTER — Encounter (HOSPITAL_COMMUNITY): Payer: Self-pay | Admitting: Orthopaedic Surgery

## 2014-07-16 NOTE — Progress Notes (Signed)
OT Cancellation Note  Patient Details Name: Rebecca Rowe MRN: [id number] DOB: 28-Nov-1985   Cancelled Treatment:    Reason Eval/Treat Not Completed: OT screened, no needs identified, will sign off. This therapist talked with patient about any questions or concerns. Pt with question regarding scooter for community mobility, discussed this with pt's RN to talk with CM in morning meeting. Also discussed use of 3-in-1 for safety during showers. Pt's significant other has been assisting with functional mobility and transfers independently. No OT needs identified at this time. Please send text page to OT services if any questions, concerns, or with new orders: [phone] OR call office at [phone]. Thank you for the order.    Shritha Bresee , MS, OTR/L, CLT Pager: [phone]  07/16/2014, 9:04 AM
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
## 2014-07-16 NOTE — Care Management (Signed)
3 in 1 ordered through [facility] . Patient requesting knee walker . Spoke to Dr Roda ShuttersXu via phone he consented to order NCM entered order , however, Dr Roda ShuttersXu has to sign order , then patient takes MD signed order to [facility] [address] Dr Roda ShuttersXu and patient aware . Awaiting MD signature .   Ronny FlurryHeather Candela Krul RN BSN [phone]
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
## 2014-07-16 NOTE — Progress Notes (Signed)
Orthopedic Tech Progress Note Patient Details:  Rebecca Rowe November 11, 1985 [id number]  Ortho Devices Ortho Device/Splint Location: trapeze bar patient helper Ortho Device/Splint Interventions: Application   Kensington Duerst 07/16/2014, 8:05 AM

## 2014-07-16 NOTE — Progress Notes (Signed)
   Subjective:  Patient reports pain as mild.    Objective:   VITALS:   Filed Vitals:   07/15/14 1444 07/15/14 2236 07/16/14 0146 07/16/14 0702  BP: 117/71 115/65 107/58 119/77  Pulse: 81 77 81 70  Temp: 97.7 F (36.5 C) 98.4 F (36.9 C) 98.5 F (36.9 C) 98.4 F (36.9 C)  TempSrc: Oral Oral Oral Oral  Resp: 22 18 18 18   Height:      Weight:      SpO2: 100% 96% 98% 100%    Neurologically intact Neurovascular intact Sensation intact distally Intact pulses distally Dorsiflexion/Plantar flexion intact Incision: dressing C/D/I and no drainage No cellulitis present Compartment soft   Lab Results  Component Value Date   WBC 6.3 07/06/2014   HGB 13.8 07/06/2014   HCT 40.6 07/06/2014   MCV 84.1 07/06/2014   PLT 274 07/06/2014     Assessment/Plan:  1 Day Post-Op   -Up with PT/OT - DVT ppx - SCDs, ambulation, asa - NWB right lower extremity - Pain controlled - Discharge planning - home today after PT - Rx in chart  Cheral AlmasXu, Dimple Bastyr Michael 07/16/2014, 7:33 AM [phone]

## 2014-07-16 NOTE — Clinical Social Work Note (Signed)
CSW received referral for SNF.  Case discussed with case manager and plan is to discharge home.  CSW to sign off please re-consult if social work needs arise.  Colbie Sliker R. Jmya Uliano, MSW, LCSWA [phone]  

## 2014-07-16 NOTE — Progress Notes (Signed)
Discharge instructions/prescriptions given and explained to pt.  Pt verbalized understanding of all orders/instructions and denies any questions at this time.  IV removed and site CDI.  Pt discharged to home with all belongings in no s/s of distress. Sherald BargeSpencer, Blinda Turek T

## 2014-07-16 NOTE — Care Management Note (Signed)
Case Management Note  Patient Details  Name: Rebecca Rowe MRN: [id number] Date of Birth: December 24, 1985  Subjective/Objective:                    Action/Plan:  Patient has signed order for knee walker and address for [facility] , and states she is able to go pick up  Knee walker  Expected Discharge Date:       07-16-14            Expected Discharge Plan:     In-House Referral:     Discharge planning Services     Post Acute Care Choice:    Choice offered to:     DME Arranged:  3-N-1 DME Agency:  [facility]  [facility]     Status of Service:  Completed, signed off  Medicare Important Message Given:    Date Medicare IM Given:    Medicare IM give by:    Date Additional Medicare IM Given:    Additional Medicare Important Message give by:     If discussed at Long Length of Stay Meetings, dates discussed:    Additional Comments:  Kingsley PlanWile, Juanita Streight Marie, RN 07/16/2014, 1:40 PM

## 2015-12-29 ENCOUNTER — Emergency Department
Admission: EM | Admit: 2015-12-29 | Discharge: 2015-12-29 | Disposition: A | Discharge status: Home / Self Care | Payer: Self-pay | Attending: Emergency Medicine | Admitting: Emergency Medicine

## 2015-12-29 ENCOUNTER — Emergency Department: Payer: Self-pay

## 2015-12-29 ENCOUNTER — Encounter: Payer: Self-pay | Admitting: Emergency Medicine

## 2015-12-29 DIAGNOSIS — R1011 Right upper quadrant pain: Secondary | ICD-10-CM | POA: Insufficient documentation

## 2015-12-29 DIAGNOSIS — F17210 Nicotine dependence, cigarettes, uncomplicated: Secondary | ICD-10-CM | POA: Insufficient documentation

## 2015-12-29 DIAGNOSIS — M940 Chondrocostal junction syndrome [Tietze]: Secondary | ICD-10-CM | POA: Insufficient documentation

## 2015-12-29 DIAGNOSIS — Z7982 Long term (current) use of aspirin: Secondary | ICD-10-CM | POA: Insufficient documentation

## 2015-12-29 DIAGNOSIS — I10 Essential (primary) hypertension: Secondary | ICD-10-CM | POA: Insufficient documentation

## 2015-12-29 DIAGNOSIS — Z79899 Other long term (current) drug therapy: Secondary | ICD-10-CM | POA: Insufficient documentation

## 2015-12-29 LAB — BASIC METABOLIC PANEL
ANION GAP: 7 (ref 5–15)
BUN: 7 mg/dL (ref 6–20)
CALCIUM: 9.5 mg/dL (ref 8.9–10.3)
CO2: 25 mmol/L (ref 22–32)
Chloride: 106 mmol/L (ref 101–111)
Creatinine, Ser: 0.87 mg/dL (ref 0.44–1.00)
GFR calc Af Amer: >60 mL/min (ref >60)
GFR calc non Af Amer: >60 mL/min (ref >60)
GLUCOSE: 108 mg/dL — AB (ref 65–99)
Potassium: 3.7 mmol/L (ref 3.5–5.1)
SODIUM: 138 mmol/L (ref 135–145)

## 2015-12-29 LAB — CBC
HEMATOCRIT: 40.9 % (ref 35.0–47.0)
HEMOGLOBIN: 13.8 g/dL (ref 12.0–16.0)
MCH: 29.9 pg (ref 26.0–34.0)
MCHC: 33.8 g/dL (ref 32.0–36.0)
MCV: 88.5 fL (ref 80.0–100.0)
Platelets: 300 10*3/uL (ref 150–440)
RBC: 4.62 MIL/uL (ref 3.80–5.20)
RDW: 15.5 % — AB (ref 11.5–14.5)
WBC: 8.4 10*3/uL (ref 3.6–11.0)

## 2015-12-29 LAB — TROPONIN I

## 2015-12-29 NOTE — ED Notes (Signed)
Patient transported to X-ray 

## 2015-12-29 NOTE — ED Triage Notes (Signed)
Pt ambulatory to triage with steady gait with c/o mid chest pain x 3 days accompanied by diarrhea. Pt denies n/v, abd pain, or urinary symptoms. Pt alert and oriented x 4, no increased work in breathing noted. Skin warm and dry.
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
## 2016-01-11 ENCOUNTER — Emergency Department (HOSPITAL_COMMUNITY)
Admission: EM | Admit: 2016-01-11 | Discharge: 2016-01-11 | Disposition: A | Discharge status: Home / Self Care | Payer: Self-pay | Attending: Emergency Medicine | Admitting: Emergency Medicine

## 2016-01-11 ENCOUNTER — Encounter (HOSPITAL_COMMUNITY): Payer: Self-pay

## 2016-01-11 ENCOUNTER — Emergency Department (HOSPITAL_COMMUNITY): Payer: Self-pay

## 2016-01-11 DIAGNOSIS — F17210 Nicotine dependence, cigarettes, uncomplicated: Secondary | ICD-10-CM | POA: Insufficient documentation

## 2016-01-11 DIAGNOSIS — Z7982 Long term (current) use of aspirin: Secondary | ICD-10-CM | POA: Insufficient documentation

## 2016-01-11 DIAGNOSIS — Z79899 Other long term (current) drug therapy: Secondary | ICD-10-CM | POA: Insufficient documentation

## 2016-01-11 DIAGNOSIS — R0789 Other chest pain: Secondary | ICD-10-CM | POA: Insufficient documentation

## 2016-01-11 DIAGNOSIS — I10 Essential (primary) hypertension: Secondary | ICD-10-CM | POA: Insufficient documentation

## 2016-01-11 LAB — CBC
HEMATOCRIT: 43.8 % (ref 36.0–46.0)
Hemoglobin: 14.9 g/dL (ref 12.0–15.0)
MCH: 30 pg (ref 26.0–34.0)
MCHC: 34 g/dL (ref 30.0–36.0)
MCV: 88.1 fL (ref 78.0–100.0)
PLATELETS: 366 10*3/uL (ref 150–400)
RBC: 4.97 MIL/uL (ref 3.87–5.11)
RDW: 15.1 % (ref 11.5–15.5)
WBC: 7.6 10*3/uL (ref 4.0–10.5)

## 2016-01-11 LAB — BASIC METABOLIC PANEL
Anion gap: 13 (ref 5–15)
BUN: 6 mg/dL (ref 6–20)
CHLORIDE: 101 mmol/L (ref 101–111)
CO2: 23 mmol/L (ref 22–32)
Calcium: 9.9 mg/dL (ref 8.9–10.3)
Creatinine, Ser: 0.93 mg/dL (ref 0.44–1.00)
GFR calc Af Amer: >60 mL/min (ref >60)
GFR calc non Af Amer: >60 mL/min (ref >60)
Glucose, Bld: 117 mg/dL — ABNORMAL HIGH (ref 65–99)
POTASSIUM: 3.7 mmol/L (ref 3.5–5.1)
SODIUM: 137 mmol/L (ref 135–145)

## 2016-01-11 LAB — D-DIMER, QUANTITATIVE (NOT AT ARMC)

## 2016-01-11 LAB — I-STAT TROPONIN, ED: Troponin i, poc: 0 ng/mL (ref 0.00–0.08)

## 2016-01-11 MED ORDER — KETOROLAC TROMETHAMINE 15 MG/ML IJ SOLN
15.0000 mg | Freq: Once | INTRAMUSCULAR | Status: AC
  Administered 2016-01-11: 15 mg via INTRAMUSCULAR
  Filled 2016-01-11: qty 1

## 2016-01-11 MED ORDER — NAPROXEN 500 MG PO TABS: 500.0000 mg | ORAL_TABLET | Freq: Two times a day (BID) | ORAL | 0 refills | Status: DC

## 2016-01-11 MED ORDER — ONDANSETRON 4 MG PO TBDP
4.0000 mg | ORAL_TABLET | Freq: Once | ORAL | Status: AC
  Administered 2016-01-11: 4 mg via ORAL

## 2016-01-11 MED ORDER — ONDANSETRON 4 MG PO TBDP
ORAL_TABLET | ORAL | Status: AC
  Filled 2016-01-11: qty 1

## 2016-01-11 NOTE — ED Triage Notes (Signed)
Pt complaining of central chest pain. Pt complaining of radiation to back and L arm. Pt with nausea/vomiting at triage. Pt with hx of anxiety.

## 2016-01-11 NOTE — ED Provider Notes (Signed)
[facility] Provider Note   CSN: [id number] Arrival date & time: 01/11/16  0256     History   Chief Complaint Chief Complaint  Patient presents with  . Chest Pain    HPI Rebecca Rowe is a 30 y.o. female.  HPI  This is a 30 year old female who presents with chest pain. Patient reports onset of pain at 8 PM. She states that it is pressure-like at times sharp. Pain started when she was having "an anxiety attack." She states that her anxiety has gotten better but her pain has not. Current pain is 5 out of 10. She denies shortness of breath. She denies any history of blood clots, leg swelling, use of birth control. Reports that she had similar symptoms 2 weeks ago and was told "it has something to do with my muscles." She is not taking anything for her pain. History of hypertension.  Past Medical History:  Diagnosis Date  . Frequent UTI   . GERD (gastroesophageal reflux disease)    otc   tums  . Hypertension    on no medications    Patient Active Problem List   Diagnosis Date Noted  . S/P ORIF (open reduction internal fixation) fracture 07/15/2014    Past Surgical History:  Procedure Laterality Date  . FRACTURE SURGERY    . ORIF ANKLE FRACTURE Right 07/15/2014   Procedure: OPEN REDUCTION INTERNAL FIXATION RIGHT BImalleolar ANKLE FRACTURE;  Surgeon: Tarry KosNaiping M Xu, MD;  Location: [facility] OR;  Service: Orthopedics;  Laterality: Right;  . ORIF ANKLE FRACTURE BIMALLEOLAR Right 07/15/2014    OB History    No data available       Home Medications    Prior to Admission medications   Medication Sig Start Date End Date Taking? Authorizing Provider  aspirin EC 325 MG tablet Take 1 tablet (325 mg total) by mouth 2 (two) times daily. 07/15/14   Naiping Donnelly StagerM Xu, MD  naproxen (NAPROSYN) 500 MG tablet Take 1 tablet (500 mg total) by mouth 2 (two) times daily. 01/11/16   Shon Batonourtney F Horton, MD  naproxen sodium (ANAPROX) 220 MG tablet Take 440 mg by mouth 2 (two) times daily as  needed (pain). ALEVE    Historical Provider, MD  oxyCODONE (OXY IR/ROXICODONE) 5 MG immediate release tablet Take 1-3 tablets (5-15 mg total) by mouth every 4 (four) hours as needed. 07/15/14   Tarry KosNaiping M Xu, MD  oxyCODONE-acetaminophen (PERCOCET/ROXICET) 5-325 MG per tablet Take 1-2 tablets by mouth every 4 (four) hours as needed for severe pain. 07/06/14   Mirian MoMatthew Gentry, MD    Family History History reviewed. No pertinent family history.  Social History Social History  Substance Use Topics  . Smoking status: Current Every Day Smoker    Packs/day: 0.50    Years: 11.00    Types: Cigarettes  . Smokeless tobacco: Never Used  . Alcohol use 12.6 oz/week    21 Glasses of wine per week     Comment: 07/15/2014 "2-3 beers/day; will rarely drink liquor; no wine"     Allergies   Patient has no known allergies.   Review of Systems Review of Systems  Constitutional: Negative for fever.  Respiratory: Positive for chest tightness. Negative for cough and shortness of breath.   Cardiovascular: Positive for chest pain. Negative for palpitations and leg swelling.  Gastrointestinal: Negative for abdominal pain, nausea and vomiting.  All other systems reviewed and are negative.    Physical Exam Updated Vital Signs BP 113/59   Pulse 69  Temp 99.2 F (37.3 C)   Resp 23   LMP 12/06/2015 (Approximate)   SpO2 97%   Physical Exam  Constitutional: She is oriented to person, place, and time. No distress.  Overweight  HENT:  Head: Normocephalic and atraumatic.  Cardiovascular: Normal rate, regular rhythm and normal heart sounds.   No murmur heard. Pulmonary/Chest: Effort normal and breath sounds normal. No respiratory distress. She has no wheezes. She exhibits tenderness.  Abdominal: Soft. Bowel sounds are normal. There is no tenderness. There is no guarding.  Neurological: She is alert and oriented to person, place, and time.  Skin: Skin is warm and dry.  Psychiatric: She has a normal mood  and affect.  Nursing note and vitals reviewed.    ED Treatments / Results  Labs (all labs ordered are listed, but only abnormal results are displayed) Labs Reviewed  BASIC METABOLIC PANEL - Abnormal; Notable for the following:       Result Value   Glucose, Bld 117 (*)    All other components within normal limits  CBC  D-DIMER, QUANTITATIVE (NOT [facility])  Rosezena SensorI-STAT TROPOININ, ED    EKG  EKG Interpretation  Date/Time:  Tuesday January 11 2016 03:06:05 EST Ventricular Rate:  91 PR Interval:  142 QRS Duration: 64 QT Interval:  358 QTC Calculation: 440 R Axis:   94 Text Interpretation:  Normal sinus rhythm with sinus arrhythmia Biatrial enlargement Rightward axis Pulmonary disease pattern Abnormal ECG Confirmed by Wilkie AyeHORTON  MD, COURTNEY ([id number]) on 01/11/2016 3:45:33 AM       Radiology Dg Chest 2 View  Result Date: 01/11/2016 CLINICAL DATA:  Central chest pain EXAM: CHEST  2 VIEW COMPARISON:  Chest radiograph 12/29/2015 FINDINGS: Cardiomediastinal contours are normal. No pneumothorax or pleural effusion. No focal airspace consolidation or pulmonary edema. IMPRESSION: Clear lungs. Electronically Signed   By: Deatra RobinsonKevin  Herman M.D.   On: 01/11/2016 03:31    Procedures Procedures (including critical care time)  Medications Ordered in ED Medications  ondansetron (ZOFRAN-ODT) disintegrating tablet 4 mg (4 mg Oral Given 01/11/16 0307)  ketorolac (TORADOL) 15 MG/ML injection 15 mg (15 mg Intramuscular Given 01/11/16 0511)     Initial Impression / Assessment and Plan / ED Course  I have reviewed the triage vital signs and the nursing notes.  Pertinent labs & imaging results that were available during my care of the patient were reviewed by me and considered in my medical decision making (see chart for details).  Clinical Course     Patient presents with chest pain. Atypical. Some reproducible pain on exam. [facility] and [facility] and diagnosed with  costochondritis. Given recurrence of symptoms, will send a d-dimer to screen for PE. Otherwise workup including EKG, chest x-ray, troponin and basic labwork reassuring. D-dimer negative. Patient improved with Toradol. Recommend naproxen twice a day.  After history, exam, and medical workup I feel the patient has been appropriately medically screened and is safe for discharge home. Pertinent diagnoses were discussed with the patient. Patient was given return precautions.   Final Clinical Impressions(s) / ED Diagnoses   Final diagnoses:  Chest wall pain    New Prescriptions New Prescriptions   NAPROXEN (NAPROSYN) 500 MG TABLET    Take 1 tablet (500 mg total) by mouth 2 (two) times daily.     Shon Batonourtney F Horton, MD 01/11/16 [phone]

## 2016-04-14 ENCOUNTER — Encounter (HOSPITAL_COMMUNITY): Payer: Self-pay

## 2016-04-14 ENCOUNTER — Emergency Department (HOSPITAL_COMMUNITY): Payer: Self-pay

## 2016-04-14 ENCOUNTER — Emergency Department (HOSPITAL_COMMUNITY)
Admission: EM | Admit: 2016-04-14 | Discharge: 2016-04-14 | Disposition: A | Discharge status: Home / Self Care | Payer: Self-pay | Attending: Emergency Medicine | Admitting: Emergency Medicine

## 2016-04-14 DIAGNOSIS — R112 Nausea with vomiting, unspecified: Secondary | ICD-10-CM | POA: Insufficient documentation

## 2016-04-14 DIAGNOSIS — I10 Essential (primary) hypertension: Secondary | ICD-10-CM | POA: Insufficient documentation

## 2016-04-14 DIAGNOSIS — Z7982 Long term (current) use of aspirin: Secondary | ICD-10-CM | POA: Insufficient documentation

## 2016-04-14 DIAGNOSIS — F17210 Nicotine dependence, cigarettes, uncomplicated: Secondary | ICD-10-CM | POA: Insufficient documentation

## 2016-04-14 DIAGNOSIS — R1013 Epigastric pain: Secondary | ICD-10-CM | POA: Insufficient documentation

## 2016-04-14 DIAGNOSIS — R197 Diarrhea, unspecified: Secondary | ICD-10-CM | POA: Insufficient documentation

## 2016-04-14 DIAGNOSIS — Z79899 Other long term (current) drug therapy: Secondary | ICD-10-CM | POA: Insufficient documentation

## 2016-04-14 LAB — CBC
HEMATOCRIT: 45.9 % (ref 36.0–46.0)
Hemoglobin: 15.3 g/dL — ABNORMAL HIGH (ref 12.0–15.0)
MCH: 29.7 pg (ref 26.0–34.0)
MCHC: 33.3 g/dL (ref 30.0–36.0)
MCV: 89.1 fL (ref 78.0–100.0)
PLATELETS: 352 10*3/uL (ref 150–400)
RBC: 5.15 MIL/uL — AB (ref 3.87–5.11)
RDW: 14.4 % (ref 11.5–15.5)
WBC: 4.9 10*3/uL (ref 4.0–10.5)

## 2016-04-14 LAB — COMPREHENSIVE METABOLIC PANEL
ALT: 65 U/L — AB (ref 14–54)
AST: 61 U/L — ABNORMAL HIGH (ref 15–41)
Albumin: 3.7 g/dL (ref 3.5–5.0)
Alkaline Phosphatase: 72 U/L (ref 38–126)
Anion gap: 10 (ref 5–15)
BUN: <5 mg/dL — ABNORMAL LOW (ref 6–20)
CHLORIDE: 103 mmol/L (ref 101–111)
CO2: 27 mmol/L (ref 22–32)
CREATININE: 0.86 mg/dL (ref 0.44–1.00)
Calcium: 9.3 mg/dL (ref 8.9–10.3)
GFR calc non Af Amer: >60 mL/min (ref >60)
Glucose, Bld: 110 mg/dL — ABNORMAL HIGH (ref 65–99)
POTASSIUM: 3.3 mmol/L — AB (ref 3.5–5.1)
SODIUM: 140 mmol/L (ref 135–145)
Total Bilirubin: 1.2 mg/dL (ref 0.3–1.2)
Total Protein: 6.9 g/dL (ref 6.5–8.1)

## 2016-04-14 LAB — URINALYSIS, ROUTINE W REFLEX MICROSCOPIC
Bilirubin Urine: NEGATIVE
GLUCOSE, UA: NEGATIVE mg/dL
KETONES UR: 5 mg/dL — AB
Leukocytes, UA: NEGATIVE
Nitrite: NEGATIVE
PROTEIN: NEGATIVE mg/dL
Specific Gravity, Urine: 1.004 — ABNORMAL LOW (ref 1.005–1.030)
pH: 6 (ref 5.0–8.0)

## 2016-04-14 LAB — POC URINE PREG, ED: PREG TEST UR: NEGATIVE

## 2016-04-14 LAB — LIPASE, BLOOD: LIPASE: 12 U/L (ref 11–51)

## 2016-04-14 MED ORDER — GI COCKTAIL ~~LOC~~
30.0000 mL | Freq: Once | ORAL | Status: AC
  Administered 2016-04-14: 30 mL via ORAL
  Filled 2016-04-14: qty 30

## 2016-04-14 MED ORDER — RANITIDINE HCL 150 MG PO CAPS: 150.0000 mg | ORAL_CAPSULE | Freq: Every day | ORAL | 0 refills | Status: AC

## 2016-04-14 MED ORDER — OMEPRAZOLE 20 MG PO CPDR
20.0000 mg | DELAYED_RELEASE_CAPSULE | Freq: Two times a day (BID) | ORAL | 0 refills | Status: AC
Start: 2016-04-14 — End: ?

## 2016-04-14 NOTE — ED Provider Notes (Signed)
[facility] Provider Note   CSN: [id number] Arrival date & time: 04/14/16  1151     History   Chief Complaint No chief complaint on file.   HPI Rebecca Rowe is a 31 y.o. female.  HPI   31 year old female with history of GERD presenting complaining of epigastric pain. Patient report while she was sitting at work today she developed an acute onset of sharp sprain to her epigastric region that spread throughout her chest. Pain initially intense but has since improved. Still rates pain as a moderate in severity nothing seems to make it better or worse. No associated fever, chills, lightheadedness, dizziness, shortness of breath, productive cough, hemoptysis, dysuria, hematuria, vaginal bleeding or vaginal discharge. Denies nausea vomiting or diarrhea. Last menstrual period was last week. Pain felt different from her usual heartburn. She denies any recent injury or straining. No specific treatment tried. She did report having strong urine odor for the past week but attributed to drinking beer. No recent alcohol use though. Last week she did have an episode of periumbilical abdominal pain with nausea vomiting diarrhea but that has since resolved.  Past Medical History:  Diagnosis Date  . Frequent UTI   . GERD (gastroesophageal reflux disease)    otc   tums  . Hypertension    on no medications    Patient Active Problem List   Diagnosis Date Noted  . S/P ORIF (open reduction internal fixation) fracture 07/15/2014    Past Surgical History:  Procedure Laterality Date  . FRACTURE SURGERY    . ORIF ANKLE FRACTURE Right 07/15/2014   Procedure: OPEN REDUCTION INTERNAL FIXATION RIGHT BImalleolar ANKLE FRACTURE;  Surgeon: Tarry Kos, MD;  Location: [facility] OR;  Service: Orthopedics;  Laterality: Right;  . ORIF ANKLE FRACTURE BIMALLEOLAR Right 07/15/2014    OB History    No data available       Home Medications    Prior to Admission medications   Medication Sig Start Date  End Date Taking? Authorizing Provider  aspirin EC 325 MG tablet Take 1 tablet (325 mg total) by mouth 2 (two) times daily. 07/15/14   Naiping Donnelly Stager, MD  naproxen (NAPROSYN) 500 MG tablet Take 1 tablet (500 mg total) by mouth 2 (two) times daily. 01/11/16   Shon Baton, MD  naproxen sodium (ANAPROX) 220 MG tablet Take 440 mg by mouth 2 (two) times daily as needed (pain). ALEVE    Historical Provider, MD  oxyCODONE (OXY IR/ROXICODONE) 5 MG immediate release tablet Take 1-3 tablets (5-15 mg total) by mouth every 4 (four) hours as needed. 07/15/14   Tarry Kos, MD  oxyCODONE-acetaminophen (PERCOCET/ROXICET) 5-325 MG per tablet Take 1-2 tablets by mouth every 4 (four) hours as needed for severe pain. 07/06/14   Mirian Mo, MD    Family History History reviewed. No pertinent family history.  Social History Social History  Substance Use Topics  . Smoking status: Current Every Day Smoker    Packs/day: 0.50    Years: 11.00    Types: Cigarettes  . Smokeless tobacco: Never Used  . Alcohol use 12.6 oz/week    21 Glasses of wine per week     Comment: 07/15/2014 "2-3 beers/day; will rarely drink liquor; no wine"     Allergies   Patient has no known allergies.   Review of Systems Review of Systems  All other systems reviewed and are negative.    Physical Exam Updated Vital Signs BP (!) 155/108 (BP Location: Right Arm)  Pulse 60   Temp 98.3 F (36.8 C) (Oral)   Resp 16   Ht  (1.676 m)   Wt 81.6 kg   LMP 04/09/2016 (Exact Date)   SpO2 100%   BMI 29.05 kg/m   Physical Exam  Constitutional: She appears well-developed and well-nourished. No distress.  HENT:  Head: Atraumatic.  Mouth/Throat: Oropharynx is clear and moist.  Eyes: Conjunctivae are normal.  Neck: Neck supple.  Cardiovascular: Normal rate and regular rhythm.   Pulmonary/Chest: Effort normal and breath sounds normal. No respiratory distress. She has no wheezes.  Abdominal: Soft. She exhibits no  distension. There is tenderness (Tenderness to epigastric region on palpation without guarding or rebound tenderness. Negative Murphy sign, no pain at McBurney's point.).  Tenderness to left posterior back on gentle palpation without any overlying skin changes.  Neurological: She is alert.  Skin: No rash noted.  Psychiatric: She has a normal mood and affect.  Nursing note and vitals reviewed.    ED Treatments / Results  Labs (all labs ordered are listed, but only abnormal results are displayed) Labs Reviewed  COMPREHENSIVE METABOLIC PANEL - Abnormal; Notable for the following:       Result Value   Potassium 3.3 (*)    Glucose, Bld 110 (*)    BUN <5 (*)    AST 61 (*)    ALT 65 (*)    All other components within normal limits  CBC - Abnormal; Notable for the following:    RBC 5.15 (*)    Hemoglobin 15.3 (*)    All other components within normal limits  URINALYSIS, ROUTINE W REFLEX MICROSCOPIC - Abnormal; Notable for the following:    Specific Gravity, Urine 1.004 (*)    Hgb urine dipstick SMALL (*)    Ketones, ur 5 (*)    Bacteria, UA FEW (*)    Squamous Epithelial / LPF 0-5 (*)    All other components within normal limits  LIPASE, BLOOD  POC URINE PREG, ED    EKG  EKG Interpretation None       Radiology Dg Chest 2 View  Result Date: 04/14/2016 CLINICAL DATA:  Chest and abdominal pain since Sunday EXAM: CHEST  2 VIEW COMPARISON:  01/11/2016 FINDINGS: Heart and mediastinal contours are within normal limits. No focal opacities or effusions. No acute bony abnormality. IMPRESSION: No active cardiopulmonary disease. Electronically Signed   By: Charlett Nose M.D.   On: 04/14/2016 13:34    Procedures Procedures (including critical care time)  Medications Ordered in ED Medications  gi cocktail (Maalox,Lidocaine,Donnatal) (30 mLs Oral Given 04/14/16 1254)     Initial Impression / Assessment and Plan / ED Course  I have reviewed the triage vital signs and the nursing  notes.  Pertinent labs & imaging results that were available during my care of the patient were reviewed by me and considered in my medical decision making (see chart for details).     BP (!) 138/96 (BP Location: Right Arm)   Pulse 66   Temp 98.3 F (36.8 C) (Oral)   Resp 16   Ht  (1.676 m)   Wt 81.6 kg   LMP 04/09/2016 (Exact Date)   SpO2 100%   BMI 29.05 kg/m    Final Clinical Impressions(s) / ED Diagnoses   Final diagnoses:  Epigastric abdominal pain  Nausea vomiting and diarrhea    New Prescriptions Discharge Medication List as of 04/14/2016  2:35 PM    START taking these medications  Details  omeprazole (PRILOSEC) 20 MG capsule Take 1 capsule (20 mg total) by mouth 2 (two) times daily before a meal., Starting Fri 04/14/2016, Print    ranitidine (ZANTAC) 150 MG capsule Take 1 capsule (150 mg total) by mouth daily., Starting Fri 04/14/2016, Print       12:42 PM Patient with history of GERD here with epigastric abdominal pain. She does have reproducible pain on exam. Pain suggestive of gastritis, GI cocktail given. Workup initiated.  1:51 PM Labs are reassuring. Patient states pain completely resolved up with GI cocktail. Suspect gastritis versus viral GI causing her symptoms. At this time I have low suspicion for acute abdominal pathology requiring CT scan or admission. Will provide symptomatic treatment. Strict return precaution discussed. Patient voiced understanding and agrees with plan.   Fayrene Helper, PA-C 04/14/16 1606    Mancel Bale, MD 04/14/16 Rickey Primus

## 2016-04-14 NOTE — Discharge Instructions (Signed)
Your pain is likely due to gastritis.  Avoid antiinflammatory medications such as ibuprofen, motrin, aleve, BC powder, Goodies powder, aspirin as these may upset your stomach.  Take prilosec and zantac 30 minutes before each meal for the next week.  Follow up with GI specialist if your symptoms persists.  Return if you have any concerns.

## 2016-04-14 NOTE — ED Triage Notes (Signed)
Pt presents with sudden onset of epigastric pain that began this morning.  Pt denies nausea or vomiting, reports diarrhea x 1 week.  Pt reports having episode of n/v/d with umbilical pain last week.

## 2017-12-02 IMAGING — DX DG CHEST 2V
2 series · 2 of 2 positions shown · non-contrast
Comparison: 01/11/2016

CLINICAL DATA: Chest and abdominal pain since [REDACTED]

EXAM:
CHEST  2 VIEW

[w chest pa]
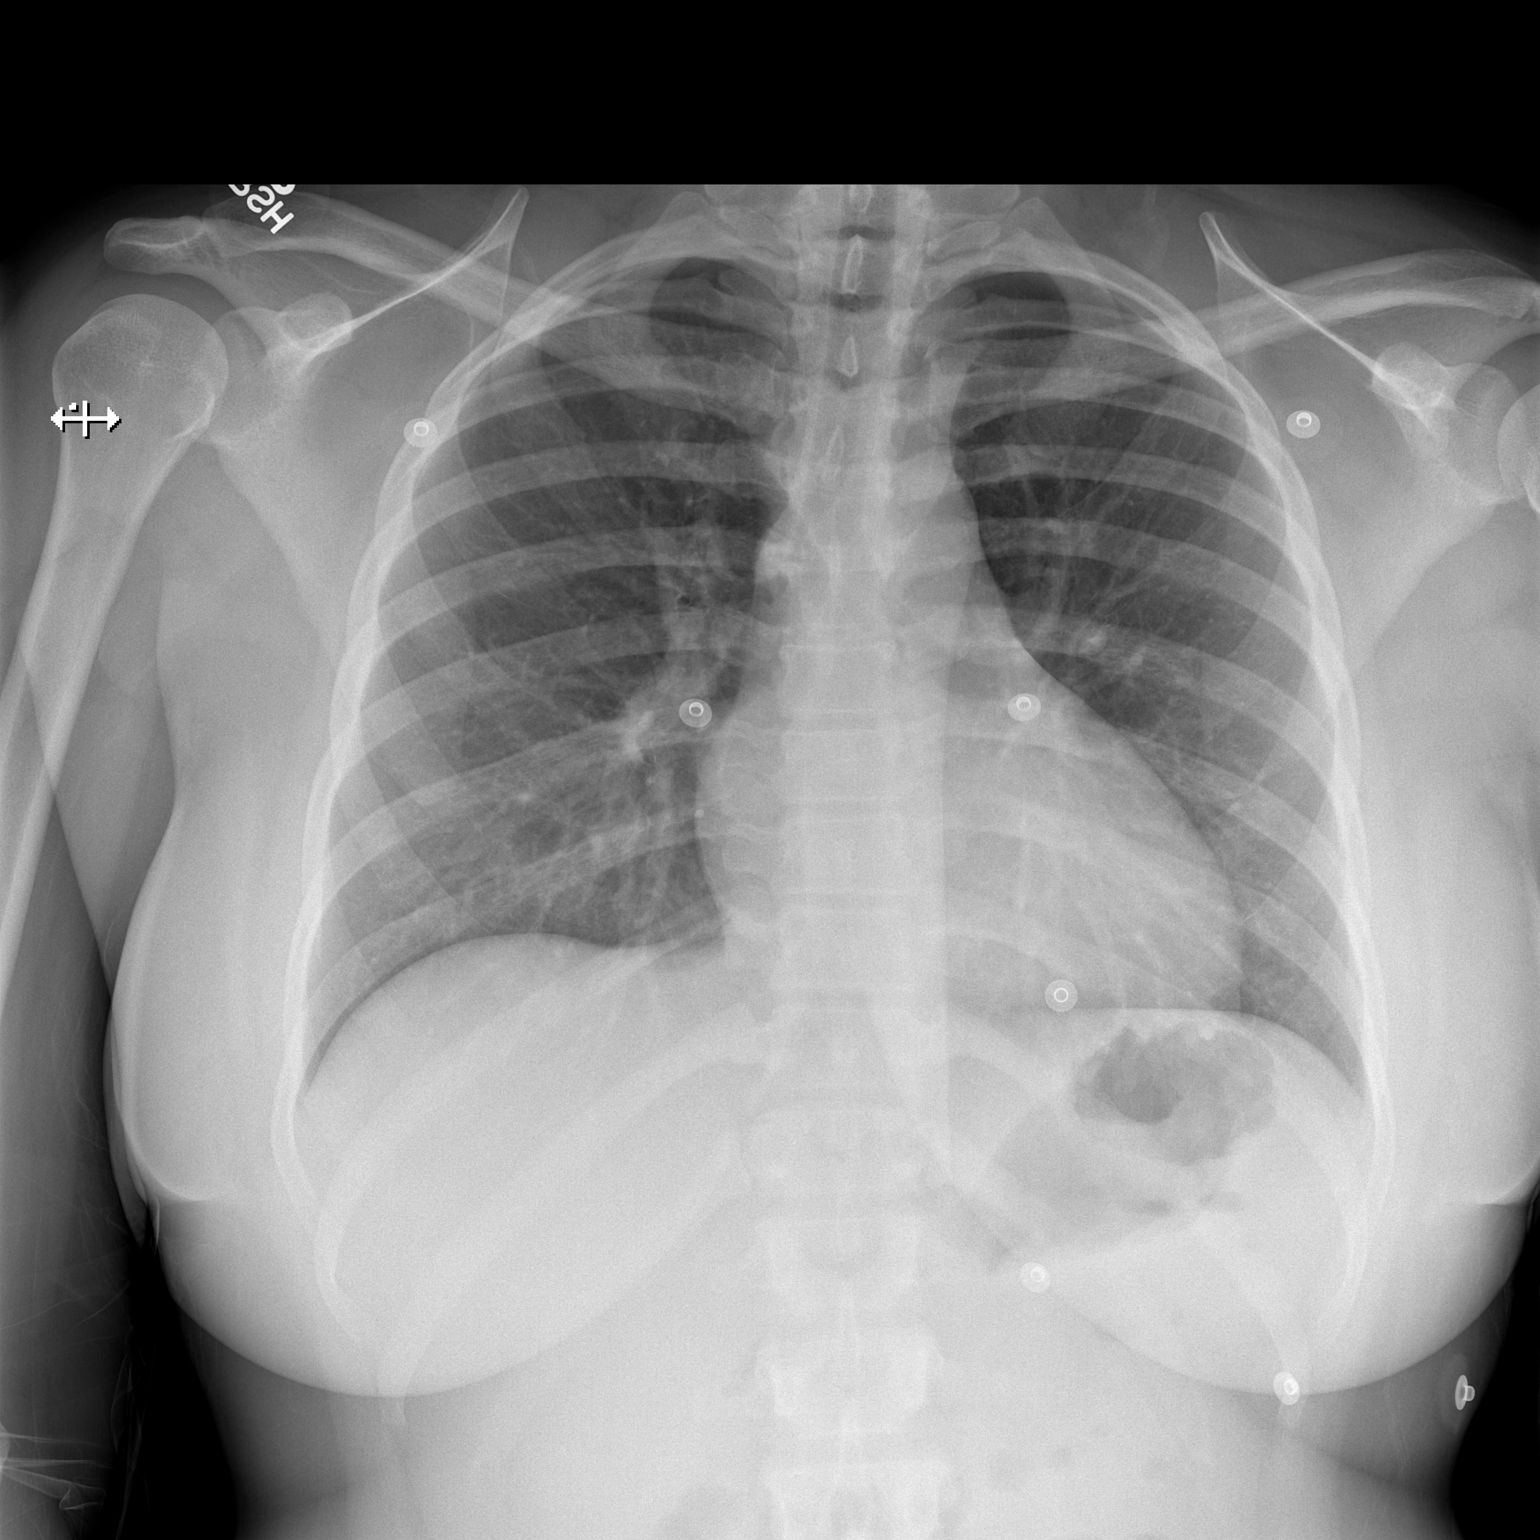

[w chest lat]
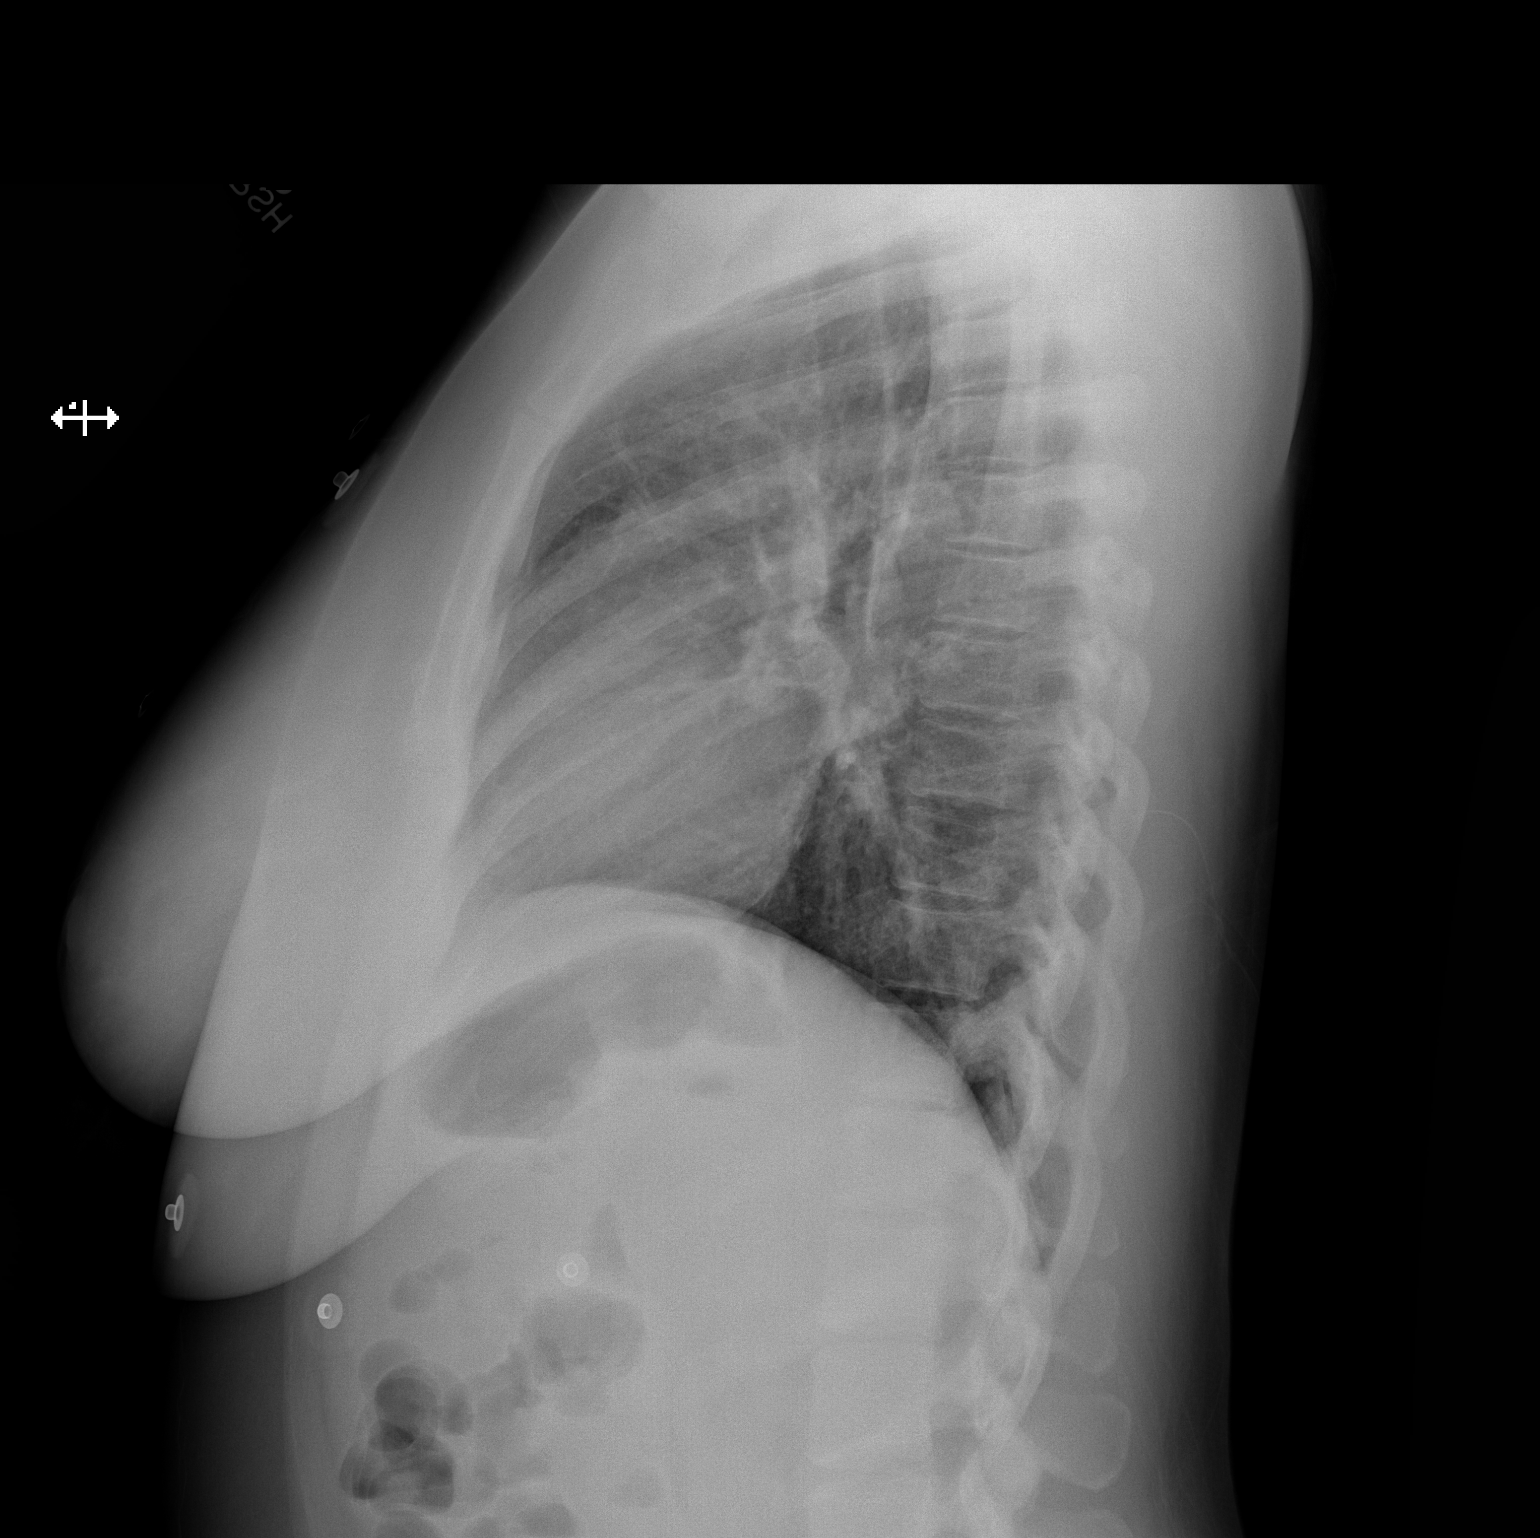

[2 of 2 positions shown; findings below may reference images not displayed]

FINDINGS: Heart and mediastinal contours are within normal limits. No focal
opacities or effusions. No acute bony abnormality.
IMPRESSION: No active cardiopulmonary disease.

## 2020-12-05 ENCOUNTER — Ambulatory Visit (HOSPITAL_COMMUNITY)
Admission: EM | Admit: 2020-12-05 | Discharge: 2020-12-05 | Disposition: A | Discharge status: Home / Self Care | Payer: 59 | Attending: Student | Admitting: Student

## 2020-12-05 ENCOUNTER — Encounter (HOSPITAL_COMMUNITY): Payer: Self-pay | Admitting: Emergency Medicine

## 2020-12-05 ENCOUNTER — Other Ambulatory Visit: Payer: Self-pay

## 2020-12-05 DIAGNOSIS — K5901 Slow transit constipation: Secondary | ICD-10-CM

## 2020-12-05 MED ORDER — LACTULOSE 10 GM/15ML PO SOLN: 10.0000 g | Freq: Two times a day (BID) | ORAL | 0 refills | Status: AC | PRN

## 2020-12-05 NOTE — ED Notes (Signed)
Patient tapping on phone throughout nursing assessment.

## 2020-12-05 NOTE — ED Triage Notes (Signed)
Last bm was over a week ago.  Patient reports using fiber and mom, and multiple enemas with minimal if any results.  Patient reports water is all that is returned with enema usage   Denies back or abdominal pain.  Patient feels pressure

## 2020-12-05 NOTE — Discharge Instructions (Addendum)
-  Lactulose as needed -Drink lots of water and eat leafy green veggies -If you still cannot pass a bowel movement after 1-2 days, or new symptoms like severe abdominal pain, you stop passing gas, nausea (especially if this tastes really bad)- head to the ED

## 2020-12-05 NOTE — ED Provider Notes (Signed)
[facility]    CSN: [id number] Arrival date & time: 12/05/20  1435      History   Chief Complaint Chief Complaint  Patient presents with   Constipation    HPI Rebecca Rowe is a 35 y.o. female presenting with constipation. Medical history GERD.  Describes constipation for about 1 week, still passing gas.  Abdominal pressure, but denies pain.  No similar issues in the past.  States that she has changed her diet recently but unable to specify in what way.  Has tried fiber supplements, milk of magnesia, multiple enemas at home with minimal improvement.  She does have some rectal pain when she tries to pass stool, but none currently, denies hemorrhoids or rectal bleeding.  Denies nausea, vomiting. She is currently menstruating.  HPI  Past Medical History:  Diagnosis Date   Frequent UTI    GERD (gastroesophageal reflux disease)    otc   tums   Hypertension    on no medications    Patient Active Problem List   Diagnosis Date Noted   S/P ORIF (open reduction internal fixation) fracture 07/15/2014    Past Surgical History:  Procedure Laterality Date   FRACTURE SURGERY     ORIF ANKLE FRACTURE Right 07/15/2014   Procedure: OPEN REDUCTION INTERNAL FIXATION RIGHT BImalleolar ANKLE FRACTURE;  Surgeon: Tarry Kos, MD;  Location: [facility] OR;  Service: Orthopedics;  Laterality: Right;   ORIF ANKLE FRACTURE BIMALLEOLAR Right 07/15/2014    OB History   No obstetric history on file.      Home Medications    Prior to Admission medications   Medication Sig Start Date End Date Taking? Authorizing Provider  lactulose (CHRONULAC) 10 GM/15ML solution Take 15 mLs (10 g total) by mouth 2 (two) times daily as needed for mild constipation. 12/05/20  Yes Rhys Martini, PA-C  omeprazole (PRILOSEC) 20 MG capsule Take 1 capsule (20 mg total) by mouth 2 (two) times daily before a meal. Patient not taking: Reported on 12/05/2020 04/14/16   Fayrene Helper, PA-C  ranitidine (ZANTAC) 150  MG capsule Take 1 capsule (150 mg total) by mouth daily. Patient not taking: Reported on 12/05/2020 04/14/16   Fayrene Helper, PA-C    Family History Family History  Family history unknown: Yes    Social History Social History   Tobacco Use   Smoking status: Former    Packs/day: 0.50    Years: 11.00    Pack years: 5.50    Types: Cigarettes   Smokeless tobacco: Never  Vaping Use   Vaping Use: Every day  Substance Use Topics   Alcohol use: Yes    Alcohol/week: 21.0 standard drinks    Types: 21 Glasses of wine per week    Comment: 07/15/2014 "2-3 beers/day; will rarely drink liquor; no wine"   Drug use: No     Allergies   Patient has no known allergies.   Review of Systems Review of Systems  Constitutional:  Negative for appetite change, chills, diaphoresis, fever and unexpected weight change.  HENT:  Negative for congestion, ear pain, sinus pressure, sinus pain, sneezing, sore throat and trouble swallowing.   Respiratory:  Negative for cough, chest tightness and shortness of breath.   Cardiovascular:  Negative for chest pain.  Gastrointestinal:  Positive for abdominal pain. Negative for abdominal distention, anal bleeding, blood in stool, constipation, diarrhea, nausea, rectal pain and vomiting.  Genitourinary:  Negative for dysuria, flank pain, frequency and urgency.  Musculoskeletal:  Negative for back pain and  myalgias.  Neurological:  Negative for dizziness, light-headedness and headaches.  All other systems reviewed and are negative.   Physical Exam Triage Vital Signs ED Triage Vitals  Enc Vitals Group     BP      Pulse      Resp      Temp      Temp src      SpO2      Weight      Height      Head Circumference      Peak Flow      Pain Score      Pain Loc      Pain Edu?      Excl. in GC?    No data found.  Updated Vital Signs BP (!) 164/98 (BP Location: Left Arm)   Pulse 68   Temp 98.3 F (36.8 C) (Oral)   Resp 20   LMP 12/04/2020   SpO2 100%    Visual Acuity Right Eye Distance:   Left Eye Distance:   Bilateral Distance:    Right Eye Near:   Left Eye Near:    Bilateral Near:     Physical Exam Vitals reviewed.  Constitutional:      General: She is not in acute distress.    Appearance: Normal appearance. She is not ill-appearing.  HENT:     Head: Normocephalic and atraumatic.     Mouth/Throat:     Mouth: Mucous membranes are moist.     Comments: Moist mucous membranes Eyes:     Extraocular Movements: Extraocular movements intact.     Pupils: Pupils are equal, round, and reactive to light.  Cardiovascular:     Rate and Rhythm: Normal rate and regular rhythm.     Heart sounds: Normal heart sounds.  Pulmonary:     Effort: Pulmonary effort is normal.     Breath sounds: Normal breath sounds. No wheezing, rhonchi or rales.  Abdominal:     General: Abdomen is protuberant. Bowel sounds are normal. There is no distension.     Palpations: Abdomen is soft. There is no mass.     Tenderness: There is generalized abdominal tenderness. There is no right CVA tenderness, left CVA tenderness, guarding or rebound. Negative signs include Murphy's sign, Rovsing's sign and McBurney's sign.     Comments: BS positive throughout Generalized tenderness without point tenderness or mass. Comfortable throughout exam.  Skin:    General: Skin is warm.     Capillary Refill: Capillary refill takes less than 2 seconds.     Comments: Good skin turgor  Neurological:     General: No focal deficit present.     Mental Status: She is alert and oriented to person, place, and time.  Psychiatric:        Mood and Affect: Mood normal.        Behavior: Behavior normal.     UC Treatments / Results  Labs (all labs ordered are listed, but only abnormal results are displayed) Labs Reviewed - No data to display  EKG   Radiology No results found.  Procedures Procedures (including critical care time)  Medications Ordered in UC Medications - No  data to display  Initial Impression / Assessment and Plan / UC Course  I have reviewed the triage vital signs and the nursing notes.  Pertinent labs & imaging results that were available during my care of the patient were reviewed by me and considered in my medical decision making (see chart for details).  This patient is a very pleasant 35 y.o. year old female presenting with constipation x1 week. Still passing gas, no n/v; low concern for SBO. Has failed home treatment with fiber, milk of magnesia, enemas. Will manage with lactulose and high-fiber diet/good hydration, with strict ED return precautions discussed. Patient verbalizes understanding and agreement. She is currently menstruating, States she is not pregnant or breastfeeding. .   Final Clinical Impressions(s) / UC Diagnoses   Final diagnoses:  Slow transit constipation     Discharge Instructions      -Lactulose as needed -Drink lots of water and eat leafy green veggies -If you still cannot pass a bowel movement after 1-2 days, or new symptoms like severe abdominal pain, you stop passing gas, nausea (especially if this tastes really bad)- head to the ED     ED Prescriptions     Medication Sig Dispense Auth. Provider   lactulose (CHRONULAC) 10 GM/15ML solution Take 15 mLs (10 g total) by mouth 2 (two) times daily as needed for mild constipation. 236 mL Rhys Martini, PA-C      PDMP not reviewed this encounter.   Rhys Martini, PA-C 12/05/20 1720
